# Patient Record
Sex: Male | Born: 1985
Health system: Southern US, Community
[De-identification: ages and names within clinical notes are randomized; demographics above are authoritative.]

## PROBLEM LIST (undated history)

## (undated) DIAGNOSIS — E7800 Pure hypercholesterolemia, unspecified: Secondary | ICD-10-CM

## (undated) DIAGNOSIS — M549 Dorsalgia, unspecified: Secondary | ICD-10-CM

## (undated) DIAGNOSIS — I10 Essential (primary) hypertension: Secondary | ICD-10-CM

## (undated) HISTORY — PX: OTHER SURGICAL HISTORY: SHX169

## (undated) HISTORY — PX: VASECTOMY: SHX75

---

## 2008-09-23 ENCOUNTER — Emergency Department (HOSPITAL_COMMUNITY): Admission: EM | Admit: 2008-09-23 | Discharge: 2008-09-23 | Payer: Self-pay | Admitting: Emergency Medicine

## 2009-07-12 ENCOUNTER — Emergency Department (HOSPITAL_COMMUNITY): Admission: EM | Admit: 2009-07-12 | Discharge: 2009-07-12 | Payer: Self-pay | Admitting: Emergency Medicine

## 2009-08-08 ENCOUNTER — Encounter: Admission: RE | Admit: 2009-08-08 | Discharge: 2009-08-08 | Payer: Self-pay | Admitting: Family Medicine

## 2009-08-08 ENCOUNTER — Emergency Department (HOSPITAL_COMMUNITY): Admission: EM | Admit: 2009-08-08 | Discharge: 2009-08-08 | Payer: Self-pay | Admitting: Family Medicine

## 2009-08-12 ENCOUNTER — Emergency Department (HOSPITAL_COMMUNITY): Admission: EM | Admit: 2009-08-12 | Discharge: 2009-08-13 | Payer: Self-pay | Admitting: Emergency Medicine

## 2010-01-14 ENCOUNTER — Emergency Department (HOSPITAL_COMMUNITY): Admission: EM | Admit: 2010-01-14 | Discharge: 2010-01-14 | Payer: Self-pay | Admitting: Emergency Medicine

## 2010-01-27 ENCOUNTER — Ambulatory Visit: Payer: Self-pay | Admitting: Interventional Radiology

## 2010-01-27 ENCOUNTER — Emergency Department (HOSPITAL_BASED_OUTPATIENT_CLINIC_OR_DEPARTMENT_OTHER): Admission: EM | Admit: 2010-01-27 | Discharge: 2010-01-27 | Payer: Self-pay | Admitting: Emergency Medicine

## 2010-09-06 LAB — POCT CARDIAC MARKERS
CKMB, poc: <1 ng/mL — ABNORMAL LOW (ref 1.0–8.0)
Myoglobin, poc: 55.4 ng/mL (ref 12–200)
Myoglobin, poc: 60.7 ng/mL (ref 12–200)
Troponin i, poc: <0.05 ng/mL (ref 0.00–0.09)

## 2010-09-06 LAB — DIFFERENTIAL
Basophils Absolute: 0.1 10*3/uL (ref 0.0–0.1)
Basophils Relative: 1 % (ref 0–1)
Eosinophils Absolute: 0 10*3/uL (ref 0.0–0.7)
Lymphs Abs: 1.6 10*3/uL (ref 0.7–4.0)
Monocytes Absolute: 0.5 10*3/uL (ref 0.1–1.0)
Monocytes Relative: 8 % (ref 3–12)

## 2010-09-06 LAB — CBC
Hemoglobin: 15.8 g/dL (ref 13.0–17.0)
MCH: 33.5 pg (ref 26.0–34.0)
Platelets: 224 10*3/uL (ref 150–400)
RBC: 4.72 MIL/uL (ref 4.22–5.81)
WBC: 6.7 10*3/uL (ref 4.0–10.5)

## 2010-09-06 LAB — POCT I-STAT, CHEM 8
Calcium, Ion: 1.05 mmol/L — ABNORMAL LOW (ref 1.12–1.32)
Chloride: 104 mEq/L (ref 96–112)
Creatinine, Ser: 1.2 mg/dL (ref 0.4–1.5)
Glucose, Bld: 97 mg/dL (ref 70–99)
Potassium: 4.2 mEq/L (ref 3.5–5.1)

## 2010-09-30 ENCOUNTER — Emergency Department (HOSPITAL_BASED_OUTPATIENT_CLINIC_OR_DEPARTMENT_OTHER)
Admission: EM | Admit: 2010-09-30 | Discharge: 2010-09-30 | Disposition: A | Discharge status: Home / Self Care | Payer: Managed Care, Other (non HMO) | Attending: Emergency Medicine | Admitting: Emergency Medicine

## 2010-09-30 DIAGNOSIS — K089 Disorder of teeth and supporting structures, unspecified: Secondary | ICD-10-CM | POA: Insufficient documentation

## 2010-09-30 DIAGNOSIS — F17200 Nicotine dependence, unspecified, uncomplicated: Secondary | ICD-10-CM | POA: Insufficient documentation

## 2010-10-29 ENCOUNTER — Emergency Department (HOSPITAL_BASED_OUTPATIENT_CLINIC_OR_DEPARTMENT_OTHER)
Admission: EM | Admit: 2010-10-29 | Discharge: 2010-10-30 | Disposition: A | Discharge status: Home / Self Care | Payer: Managed Care, Other (non HMO) | Attending: Emergency Medicine | Admitting: Emergency Medicine

## 2010-10-29 ENCOUNTER — Emergency Department (INDEPENDENT_AMBULATORY_CARE_PROVIDER_SITE_OTHER): Payer: Managed Care, Other (non HMO)

## 2010-10-29 DIAGNOSIS — S91309A Unspecified open wound, unspecified foot, initial encounter: Secondary | ICD-10-CM

## 2010-10-29 DIAGNOSIS — W268XXA Contact with other sharp object(s), not elsewhere classified, initial encounter: Secondary | ICD-10-CM | POA: Insufficient documentation

## 2010-12-22 ENCOUNTER — Emergency Department (HOSPITAL_BASED_OUTPATIENT_CLINIC_OR_DEPARTMENT_OTHER)
Admission: EM | Admit: 2010-12-22 | Discharge: 2010-12-22 | Disposition: A | Discharge status: Home / Self Care | Payer: Managed Care, Other (non HMO) | Attending: Emergency Medicine | Admitting: Emergency Medicine

## 2010-12-22 DIAGNOSIS — X500XXA Overexertion from strenuous movement or load, initial encounter: Secondary | ICD-10-CM | POA: Insufficient documentation

## 2010-12-22 DIAGNOSIS — Y9289 Other specified places as the place of occurrence of the external cause: Secondary | ICD-10-CM | POA: Insufficient documentation

## 2010-12-22 DIAGNOSIS — S4350XA Sprain of unspecified acromioclavicular joint, initial encounter: Secondary | ICD-10-CM | POA: Insufficient documentation

## 2011-02-26 ENCOUNTER — Encounter: Payer: Self-pay | Admitting: *Deleted

## 2011-02-26 DIAGNOSIS — S52599A Other fractures of lower end of unspecified radius, initial encounter for closed fracture: Secondary | ICD-10-CM | POA: Insufficient documentation

## 2011-02-26 DIAGNOSIS — X58XXXA Exposure to other specified factors, initial encounter: Secondary | ICD-10-CM | POA: Insufficient documentation

## 2011-02-26 NOTE — ED Notes (Signed)
C/o right wrist pain after being hit by car door, +PMS

## 2011-02-27 ENCOUNTER — Emergency Department (HOSPITAL_BASED_OUTPATIENT_CLINIC_OR_DEPARTMENT_OTHER)
Admission: EM | Admit: 2011-02-27 | Discharge: 2011-02-27 | Disposition: A | Discharge status: Home / Self Care | Payer: Managed Care, Other (non HMO) | Attending: Emergency Medicine | Admitting: Emergency Medicine

## 2011-02-27 ENCOUNTER — Emergency Department (INDEPENDENT_AMBULATORY_CARE_PROVIDER_SITE_OTHER): Payer: Managed Care, Other (non HMO)

## 2011-02-27 DIAGNOSIS — M25539 Pain in unspecified wrist: Secondary | ICD-10-CM

## 2011-02-27 DIAGNOSIS — S52599A Other fractures of lower end of unspecified radius, initial encounter for closed fracture: Secondary | ICD-10-CM

## 2011-02-27 DIAGNOSIS — IMO0002 Reserved for concepts with insufficient information to code with codable children: Secondary | ICD-10-CM

## 2011-02-27 MED ORDER — OXYCODONE-ACETAMINOPHEN 5-325 MG PO TABS: 2.0000 | ORAL_TABLET | ORAL | Status: AC | PRN

## 2011-02-27 MED ORDER — KETOROLAC TROMETHAMINE 60 MG/2ML IM SOLN
60.0000 mg | Freq: Once | INTRAMUSCULAR | Status: AC
  Administered 2011-02-27: 60 mg via INTRAMUSCULAR
  Filled 2011-02-27: qty 2

## 2011-02-27 NOTE — ED Provider Notes (Signed)
History     CSN: [id number] Arrival date & time: 02/27/2011 12:47 AM  Chief Complaint  Patient presents with  . Wrist Pain   Patient is a 25 y.o. male presenting with wrist pain. The history is provided by the patient. No language interpreter was used.  Wrist Pain This is a new problem. The problem has not changed since onset.Pertinent negatives include no chest pain, no abdominal pain, no headaches and no shortness of breath. The symptoms are aggravated by nothing. The symptoms are relieved by nothing. He has tried nothing for the symptoms. The treatment provided no relief.  Slammed distal right forearm in the car door.  10/10 pain   History reviewed. No pertinent past medical history.  History reviewed. No pertinent past surgical history.  History reviewed. No pertinent family history.  History  Substance Use Topics  . Smoking status: Never Smoker   . Smokeless tobacco: Not on file  . Alcohol Use: No      Review of Systems  Respiratory: Negative for shortness of breath.   Cardiovascular: Negative for chest pain.  Gastrointestinal: Negative for abdominal pain.  Neurological: Negative for headaches.    Physical Exam  BP 145/89  Pulse 97  Temp(Src) 98.6 F (37 C) (Oral)  Resp 17  Ht 5\' 10"  (1.778 m)  Wt 260 lb (117.935 kg)  BMI 37.31 kg/m2  SpO2 100%  Physical Exam  ED Course  Procedures  MDM Follow up with orthopedics      Ashwin Tibbs K Ulice Follett-Rasch, MD 02/27/11 4098

## 2011-02-27 NOTE — ED Notes (Signed)
Pt with small abrasion to right wrist wound cleaned and ointment applied as well as bandage

## 2011-06-25 ENCOUNTER — Emergency Department (HOSPITAL_BASED_OUTPATIENT_CLINIC_OR_DEPARTMENT_OTHER)
Admission: EM | Admit: 2011-06-25 | Discharge: 2011-06-25 | Disposition: A | Discharge status: Home / Self Care | Payer: Managed Care, Other (non HMO) | Attending: Emergency Medicine | Admitting: Emergency Medicine

## 2011-06-25 ENCOUNTER — Encounter (HOSPITAL_BASED_OUTPATIENT_CLINIC_OR_DEPARTMENT_OTHER): Payer: Self-pay | Admitting: *Deleted

## 2011-06-25 DIAGNOSIS — K0889 Other specified disorders of teeth and supporting structures: Secondary | ICD-10-CM

## 2011-06-25 DIAGNOSIS — K0381 Cracked tooth: Secondary | ICD-10-CM | POA: Insufficient documentation

## 2011-06-25 DIAGNOSIS — K089 Disorder of teeth and supporting structures, unspecified: Secondary | ICD-10-CM | POA: Insufficient documentation

## 2011-06-25 MED ORDER — CLINDAMYCIN HCL 150 MG PO CAPS: 300.0000 mg | ORAL_CAPSULE | Freq: Four times a day (QID) | ORAL | Status: AC

## 2011-06-25 MED ORDER — HYDROCODONE-ACETAMINOPHEN 5-325 MG PO TABS: 2.0000 | ORAL_TABLET | ORAL | Status: AC | PRN

## 2011-06-25 NOTE — ED Notes (Signed)
Right upper molar pain. Broke the tooth a couple of months ago.

## 2011-06-25 NOTE — ED Provider Notes (Signed)
History     CSN: [id number]  Arrival date & time 06/25/11  1114   First MD Initiated Contact with Patient 06/25/11 1336      Chief Complaint  Patient presents with  . Dental Pain    (Consider location/radiation/quality/duration/timing/severity/associated sxs/prior treatment) Patient is a 26 y.o. male presenting with tooth pain and dental injury. The history is provided by the patient. No language interpreter was used.  Dental PainThe primary symptoms include dental injury. The symptoms began 3 to 5 days ago. The symptoms are worsening. The symptoms are recurrent. The symptoms occur constantly.  Additional symptoms include: gum swelling and gum tenderness. Medical issues do not include: alcohol problem and smoking.   Dental Injury This is a new problem. The current episode started 1 to 4 weeks ago. The problem occurs constantly. The problem has been gradually worsening. The symptoms are aggravated by nothing. He has tried nothing for the symptoms.  Pt broke a tooth several months ago.  Pt reports now area is swollen and painful  History reviewed. No pertinent past medical history.  History reviewed. No pertinent past surgical history.  No family history on file.  History  Substance Use Topics  . Smoking status: Never Smoker   . Smokeless tobacco: Not on file  . Alcohol Use: No      Review of Systems  All other systems reviewed and are negative.    Allergies  Review of patient's allergies indicates no known allergies.  Home Medications  No current outpatient prescriptions on file.  BP 148/78  Pulse 92  Temp(Src) 98.3 F (36.8 C) (Oral)  Resp 20  SpO2 100%  Physical Exam  Nursing note and vitals reviewed. Constitutional: He is oriented to person, place, and time. He appears well-developed and well-nourished.  HENT:  Head: Normocephalic and atraumatic.  Right Ear: External ear normal.  Left Ear: External ear normal.       Broken right lower 2nd molar,  Broken  left lower 2nd molar  Eyes: Conjunctivae are normal. Pupils are equal, round, and reactive to light.  Neck: Normal range of motion.  Cardiovascular: Normal rate and regular rhythm.   Pulmonary/Chest: Effort normal.  Abdominal: Soft.  Musculoskeletal: Normal range of motion.  Neurological: He is alert and oriented to person, place, and time.  Skin: Skin is warm.  Psychiatric: He has a normal mood and affect.    ED Course  Procedures (including critical care time)  Labs Reviewed - No data to display No results found.   No diagnosis found.    MDM    Pt has a dental appointment in February     [place], [place] 06/25/11 1351

## 2011-06-26 NOTE — ED Provider Notes (Signed)
Medical screening examination/treatment/procedure(s) were performed by non-physician practitioner and as supervising physician I was immediately available for consultation/collaboration.  Emagene Merfeld, MD 06/26/11 0653 

## 2011-09-03 ENCOUNTER — Emergency Department (HOSPITAL_BASED_OUTPATIENT_CLINIC_OR_DEPARTMENT_OTHER)
Admission: EM | Admit: 2011-09-03 | Discharge: 2011-09-03 | Disposition: A | Discharge status: Home / Self Care | Payer: Managed Care, Other (non HMO) | Attending: Emergency Medicine | Admitting: Emergency Medicine

## 2011-09-03 ENCOUNTER — Encounter (HOSPITAL_BASED_OUTPATIENT_CLINIC_OR_DEPARTMENT_OTHER): Payer: Self-pay | Admitting: *Deleted

## 2011-09-03 ENCOUNTER — Emergency Department (INDEPENDENT_AMBULATORY_CARE_PROVIDER_SITE_OTHER): Payer: Managed Care, Other (non HMO)

## 2011-09-03 DIAGNOSIS — J3489 Other specified disorders of nose and nasal sinuses: Secondary | ICD-10-CM | POA: Insufficient documentation

## 2011-09-03 DIAGNOSIS — R05 Cough: Secondary | ICD-10-CM

## 2011-09-03 DIAGNOSIS — R059 Cough, unspecified: Secondary | ICD-10-CM

## 2011-09-03 DIAGNOSIS — R070 Pain in throat: Secondary | ICD-10-CM | POA: Insufficient documentation

## 2011-09-03 MED ORDER — HYDROCOD POLST-CHLORPHEN POLST 10-8 MG/5ML PO LQCR: 5.0000 mL | Freq: Two times a day (BID) | ORAL | Status: DC | PRN

## 2011-09-03 NOTE — ED Provider Notes (Signed)
History     CSN: [id number]  Arrival date & time 09/03/11  1312   First MD Initiated Contact with Patient 09/03/11 1342      Chief Complaint  Patient presents with  . Cough    (Consider location/radiation/quality/duration/timing/severity/associated sxs/prior treatment) Patient is a 26 y.o. male presenting with cough. The history is provided by the patient. No language interpreter was used.  Cough This is a new problem. The current episode started more than 2 days ago. The problem occurs constantly. The problem has not changed since onset.The cough is productive of sputum. There has been no fever. Associated symptoms include rhinorrhea and sore throat. Pertinent negatives include no myalgias. He has tried decongestants for the symptoms. The treatment provided no relief. He is not a smoker.    History reviewed. No pertinent past medical history.  History reviewed. No pertinent past surgical history.  No family history on file.  History  Substance Use Topics  . Smoking status: Never Smoker   . Smokeless tobacco: Not on file  . Alcohol Use: No      Review of Systems  HENT: Positive for sore throat and rhinorrhea.   Respiratory: Positive for cough.   Musculoskeletal: Negative for myalgias.  All other systems reviewed and are negative.    Allergies  Review of patient's allergies indicates no known allergies.  Home Medications  No current outpatient prescriptions on file.  BP 145/87  Pulse 95  Temp(Src) 98 F (36.7 C) (Oral)  Resp 18  Ht 5\' 11"  (1.803 m)  Wt 260 lb (117.935 kg)  BMI 36.26 kg/m2  SpO2 100%  Physical Exam  Nursing note and vitals reviewed. Constitutional: He is oriented to person, place, and time. He appears well-developed and well-nourished.  HENT:  Head: Normocephalic and atraumatic.  Right Ear: External ear normal.  Left Ear: External ear normal.  Mouth/Throat: Posterior oropharyngeal erythema present.  Eyes: Conjunctivae and EOM are  normal.  Neck: Neck supple.  Cardiovascular: Normal rate and regular rhythm.   Pulmonary/Chest: Effort normal and breath sounds normal.  Musculoskeletal: Normal range of motion.  Neurological: He is alert and oriented to person, place, and time.  Skin: Skin is warm.  Psychiatric: He has a normal mood and affect.    ED Course  Procedures (including critical care time)  Labs Reviewed - No data to display Dg Chest 2 View  09/03/2011  *RADIOLOGY REPORT*  Clinical Data: Cough.  Sinus congestion.  CHEST - 2 VIEW  Comparison:  01/14/2010  Findings:  The heart size and mediastinal contours are within normal limits.  Both lungs are clear.  The visualized skeletal structures are unremarkable.  IMPRESSION: No active cardiopulmonary disease.  Original Report Authenticated By: Danae Orleans, M.D.     1. Cough       MDM  Symptoms likely viral:will treat symptomatically       Teressa Lower, NP 09/03/11 1512

## 2011-09-03 NOTE — Discharge Instructions (Signed)
Cough, Adult  A cough is a reflex. It helps you clear your throat and airways. A cough can help heal your body. A cough can last 2 or 3 weeks (acute) or may last more than 8 weeks (chronic). Some common causes of a cough can include an infection, allergy, or a cold. HOME CARE  Only take medicine as told by your doctor.   If given, take your medicines (antibiotics) as told. Finish them even if you start to feel better.   Use a cold steam vaporizer or humidier in your home. This can help loosen thick spit (secretions).   Sleep so you are almost sitting up (semi-upright). Use pillows to do this. This helps reduce coughing.   Rest as needed.   Stop smoking if you smoke.  GET HELP RIGHT AWAY IF:  You have yellowish-white fluid (pus) in your thick spit.   Your cough gets worse.   Your medicine does not reduce coughing, and you are losing sleep.   You cough up blood.   You have trouble breathing.   Your pain gets worse and medicine does not help.   You have a fever.  MAKE SURE YOU:   Understand these instructions.   Will watch your condition.   Will get help right away if you are not doing well or get worse.  Document Released: 02/19/2011 Document Revised: 05/28/2011 Document Reviewed: 02/19/2011 Exit[employer] Patient Information 2012 [employer]. 

## 2011-09-03 NOTE — ED Notes (Signed)
Stuffy nose and cough

## 2011-09-17 NOTE — ED Provider Notes (Signed)
Evaluation and management procedures were performed by the PA/NP under my supervision/collaboration.   [place] Carmack D Domonique Brouillard, MD 09/17/11 1020 

## 2011-11-30 ENCOUNTER — Emergency Department (HOSPITAL_BASED_OUTPATIENT_CLINIC_OR_DEPARTMENT_OTHER)
Admission: EM | Admit: 2011-11-30 | Discharge: 2011-11-30 | Disposition: A | Discharge status: Home / Self Care | Payer: Worker's Compensation | Attending: Emergency Medicine | Admitting: Emergency Medicine

## 2011-11-30 ENCOUNTER — Encounter (HOSPITAL_BASED_OUTPATIENT_CLINIC_OR_DEPARTMENT_OTHER): Payer: Self-pay | Admitting: *Deleted

## 2011-11-30 DIAGNOSIS — M545 Low back pain, unspecified: Secondary | ICD-10-CM

## 2011-11-30 MED ORDER — IBUPROFEN 800 MG PO TABS: 800.0000 mg | ORAL_TABLET | Freq: Three times a day (TID) | ORAL | Status: AC | PRN

## 2011-11-30 MED ORDER — HYDROCODONE-ACETAMINOPHEN 5-325 MG PO TABS: ORAL_TABLET | ORAL | Status: AC

## 2011-11-30 MED ORDER — METHOCARBAMOL 500 MG PO TABS: 1000.0000 mg | ORAL_TABLET | Freq: Four times a day (QID) | ORAL | Status: AC

## 2011-11-30 NOTE — Discharge Instructions (Signed)
Please read and follow all provided instructions.  Your diagnoses today include:  1. Low back pain     Tests performed today include:  Vital signs - see below for your results today  Medications prescribed:   Vicodin (hydrocodone/acetaminophen) - narcotic pain medication  You have been prescribed narcotic pain medication such as Vicodin or Percocet: DO NOT drive or perform any activities that require you to be awake and alert because this medicine can make you drowsy. BE VERY CAREFUL not to take multiple medicines containing Tylenol (also called acetaminophen). Doing so can lead to an overdose which can damage your liver and cause liver failure and possibly death.    Robaxin (methocarbamol) - muscle relaxer medication  You have been prescribed a muscle relaxer medication such as Robaxin, Flexeril, or Valium: DO NOT drive or perform any activities that require you to be awake and alert because this medicine can make you drowsy.    Ibuprofen - anti-inflammatory pain medication  Do not exceed 800mg  ibuprofen every 8 hours  You have been prescribed an anti-inflammatory medication or NSAID. Take with food. Take smallest effective dose for the shortest duration needed for your pain. Stop taking if you experience stomach pain or vomiting.   Take any prescribed medications only as directed.  Home care instructions:   Follow any educational materials contained in this packet  Please rest, use ice or heat on your back for the next several days  Do not lift, push, pull anything more than 10 pounds for the next week  Follow-up instructions: Please follow-up with your primary care provider in the next 1 week for further evaluation of your symptoms. If you do not have a primary care doctor -- see below for referral information.   Return instructions:  SEEK IMMEDIATE MEDICAL ATTENTION IF YOU HAVE:  New numbness, tingling, weakness, or problem with the use of your arms or legs  Severe  back pain not relieved with medications  Loss control of your bowels or bladder  Increasing pain in any areas of the body (such as chest or abdominal pain)  Shortness of breath, dizziness, or fainting.   Worsening nausea (feeling sick to your stomach), vomiting, fever, or sweats  Any other emergent concerns regarding your health   Additional Information:  Your vital signs today were: BP 142/86  Pulse 102  Temp(Src) 99.1 F (37.3 C) (Oral)  Resp 20  SpO2 100% If your blood pressure (BP) was elevated above 135/85 this visit, please have this repeated by your doctor within one month. -------------- No Primary Care Doctor Call [facility]  [phone] Other agencies that provide inexpensive medical care    [facility]  [phone]    [facility]  [phone]    [facility]  [phone]    [facility]  [phone]    [facility]  3080770889    [facility]  [phone] -------------- RESOURCE GUIDE:  Dental Problems  Patients with Medicaid: [facility] [phone] [address]                                            [phone] [address] Phone:  [phone]  Phone:  [phone]  If unable to pay or uninsured, contact:  [facility] or [facility]. to become qualified for the adult dental clinic.  Chronic Pain Problems Contact [facility]  [phone] Patients need to be referred by their primary care doctor.  Insufficient Money for Medicine Contact [employer]:  call "211" or [facility] [phone].  Psychological Services [facility]  [phone] [facility]  [phone] [facility]   [phone] (emergency services [phone])  Substance Abuse Resources [facility]  [phone] [facility] [phone] [facility]  [phone] Floydene Flock [phone] Residential & Outpatient Substance Abuse Program  [phone]  Abuse/Neglect [facility] Child Abuse Hotline 3235231804 [facility] Child Abuse Hotline [phone] (After Hours)  Emergency Shelter [facility] [phone]  Maternity Homes [facility] [place] [facility] [phone] [facility] [phone]  [facility] Resources  [facility]     [employer]                          [facility] Dept. [address] [place]                       [address]      [address]  Blondell Reveal Phone:  [phone]                                   Phone:  [phone]                 Phone:  [phone]  [place] [facility] Phone:  [phone]  [facility] Child Abuse Hotline [phone])047-5451 [phone])[phone] (After Hours)

## 2011-11-30 NOTE — ED Notes (Signed)
Lower back pain after lifting 200# object at work.

## 2011-11-30 NOTE — ED Provider Notes (Signed)
Medical screening examination/treatment/procedure(s) were performed by non-physician practitioner and as supervising physician I was immediately available for consultation/collaboration.   Jahel Wavra Y. Brinly Maietta, MD 11/30/11 2347 

## 2011-11-30 NOTE — ED Provider Notes (Signed)
History     CSN: [id number]  Arrival date & time 11/30/11  1757   First MD Initiated Contact with Patient 11/30/11 1847      Chief Complaint  Patient presents with  . Back Pain    (Consider location/radiation/quality/duration/timing/severity/associated sxs/prior treatment) HPI Comments: Patient presents with complaint of left-sided lower back pain that began acutely after he tried to lift a 200 pound object at work. Patient states he has had pain that has been persistent since that time. No treatments prior to arrival. Pain is made worse with certain positions including bending. It is not made worse by twisting. Patient denies numbness, tingling, or weakness in his lower extremities. He denies loss of bowel or bladder control. Patient did not hear a popping sound. Patient denies other injuries.  Patient is a 26 y.o. male presenting with back pain. The history is provided by the patient.  Back Pain  This is a new problem. The current episode started 3 to 5 hours ago. The problem occurs constantly. The problem has not changed since onset.The pain is associated with lifting heavy objects. The pain is present in the lumbar spine (left sided). The quality of the pain is described as aching. The pain does not radiate. The pain is moderate. The symptoms are aggravated by bending. Pertinent negatives include no fever, no numbness, no abdominal pain, no bowel incontinence, no bladder incontinence, no dysuria, no tingling and no weakness. He has tried nothing for the symptoms.    History reviewed. No pertinent past medical history.  History reviewed. No pertinent past surgical history.  No family history on file.  History  Substance Use Topics  . Smoking status: Never Smoker   . Smokeless tobacco: Not on file  . Alcohol Use: No      Review of Systems  Constitutional: Negative for fever and unexpected weight change.  Gastrointestinal: Negative for abdominal pain, constipation and bowel  incontinence.       Neg for fecal incontinence  Genitourinary: Negative for bladder incontinence, dysuria, hematuria, flank pain and difficulty urinating.       Negative for urinary incontinence or retention  Musculoskeletal: Positive for back pain.  Neurological: Negative for tingling, weakness and numbness.       Negative for saddle paresthesias     Allergies  Review of patient's allergies indicates no known allergies.  Home Medications  No current outpatient prescriptions on file.  BP 142/86  Pulse 102  Temp(Src) 99.1 F (37.3 C) (Oral)  Resp 20  SpO2 100%  Physical Exam  Nursing note and vitals reviewed. Constitutional: He appears well-developed and well-nourished.  HENT:  Head: Normocephalic and atraumatic.  Eyes: Conjunctivae are normal.  Neck: Normal range of motion.  Abdominal: Soft. There is no tenderness. There is no CVA tenderness.  Musculoskeletal: Normal range of motion. He exhibits no tenderness.       There is no tenderness to palpation over cervical/thoracic/lumbar/sacral spine. Tenderness to palpation over L lumbar paraspinal muscles. No step-off noted with palpation of spine.   Neurological: He is alert. He has normal reflexes. No sensory deficit. He exhibits normal muscle tone. Gait normal. GCS eye subscore is 4. GCS verbal subscore is 5. GCS motor subscore is 6.       5/5 strength in entire lower extremities bilaterally. No sensation deficit.   Skin: Skin is warm and dry.  Psychiatric: He has a normal mood and affect.    ED Course  Procedures (including critical care time)  Labs Reviewed -  No data to display No results found.   1. Low back pain     7:20 PM Patient seen and examined. Work-up initiated. Medications ordered.   Vital signs reviewed and are as follows: Filed Vitals:   11/30/11 1805  BP: 142/86  Pulse: 102  Temp: 99.1 F (37.3 C)  Resp: 20   No red flag s/s of low back pain. Patient was counseled on back pain precautions and  told to do activity as tolerated but do not lift, push, or pull heavy objects more than 10 pounds for the next week.  Patient counseled to use ice or heat on back for no longer than 15 minutes every hour.   Patient prescribed muscle relaxer and counseled on proper use of muscle relaxant medication.    Patient prescribed narcotic pain medicine and counseled on proper use of narcotic pain medications. Counseled not to combine this medication with others containing tylenol.   Urged patient not to drink alcohol, drive, or perform any other activities that requires focus while taking either of these medications.  Patient urged to follow-up with PCP if pain does not improve with treatment and rest or if pain becomes recurrent. Urged to return with worsening severe pain, loss of bowel or bladder control, trouble walking.   The patient verbalizes understanding and agrees with the plan.   MDM  Patient with back pain. No neurological deficits. Patient is ambulatory. No warning symptoms of back pain including: loss of bowel or bladder control, night sweats, waking from sleep with back pain, unexplained fevers or weight loss, h/o cancer, IVDU, recent trauma. No concern for cauda equina, epidural abscess, or other serious cause of back pain. Conservative measures such as rest, ice/heat and pain medicine indicated with PCP follow-up if no improvement with conservative management.          Renne Crigler, [place] 11/30/11 2303

## 2011-11-30 NOTE — ED Notes (Signed)
Pt. Has been seen by Dr. Oletta Lamas and is in no distress

## 2013-04-27 ENCOUNTER — Encounter (HOSPITAL_BASED_OUTPATIENT_CLINIC_OR_DEPARTMENT_OTHER): Payer: Self-pay | Admitting: Emergency Medicine

## 2013-04-27 ENCOUNTER — Emergency Department (HOSPITAL_BASED_OUTPATIENT_CLINIC_OR_DEPARTMENT_OTHER)
Admission: EM | Admit: 2013-04-27 | Discharge: 2013-04-28 | Disposition: A | Discharge status: Home / Self Care | Payer: Managed Care, Other (non HMO) | Attending: Emergency Medicine | Admitting: Emergency Medicine

## 2013-04-27 DIAGNOSIS — M5416 Radiculopathy, lumbar region: Secondary | ICD-10-CM

## 2013-04-27 DIAGNOSIS — IMO0002 Reserved for concepts with insufficient information to code with codable children: Secondary | ICD-10-CM | POA: Insufficient documentation

## 2013-04-27 LAB — URINALYSIS, ROUTINE W REFLEX MICROSCOPIC
Hgb urine dipstick: NEGATIVE
Leukocytes, UA: NEGATIVE
Specific Gravity, Urine: 1.034 — ABNORMAL HIGH (ref 1.005–1.030)
Urobilinogen, UA: 1 mg/dL (ref 0.0–1.0)

## 2013-04-27 NOTE — ED Notes (Signed)
Abdominal pain in his left lower quadrant into his groin. Pain started this am and has gotten worse throughout the day.

## 2013-04-28 ENCOUNTER — Emergency Department (HOSPITAL_BASED_OUTPATIENT_CLINIC_OR_DEPARTMENT_OTHER): Payer: Managed Care, Other (non HMO)

## 2013-04-28 MED ORDER — HYDROCODONE-ACETAMINOPHEN 5-325 MG PO TABS: 1.0000 | ORAL_TABLET | Freq: Four times a day (QID) | ORAL | Status: DC | PRN
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
## 2013-09-09 ENCOUNTER — Emergency Department (HOSPITAL_BASED_OUTPATIENT_CLINIC_OR_DEPARTMENT_OTHER)
Admission: EM | Admit: 2013-09-09 | Discharge: 2013-09-09 | Disposition: A | Discharge status: Home / Self Care | Payer: Managed Care, Other (non HMO) | Attending: Emergency Medicine | Admitting: Emergency Medicine

## 2013-09-09 ENCOUNTER — Emergency Department (HOSPITAL_BASED_OUTPATIENT_CLINIC_OR_DEPARTMENT_OTHER): Payer: Managed Care, Other (non HMO)

## 2013-09-09 ENCOUNTER — Encounter (HOSPITAL_BASED_OUTPATIENT_CLINIC_OR_DEPARTMENT_OTHER): Payer: Self-pay | Admitting: Emergency Medicine

## 2013-09-09 DIAGNOSIS — M25579 Pain in unspecified ankle and joints of unspecified foot: Secondary | ICD-10-CM | POA: Insufficient documentation

## 2013-09-09 DIAGNOSIS — M79673 Pain in unspecified foot: Secondary | ICD-10-CM

## 2013-09-09 DIAGNOSIS — Z9889 Other specified postprocedural states: Secondary | ICD-10-CM | POA: Insufficient documentation

## 2013-09-09 MED ORDER — IBUPROFEN 800 MG PO TABS: 800.0000 mg | ORAL_TABLET | Freq: Three times a day (TID) | ORAL | Status: DC

## 2013-09-09 MED ORDER — HYDROCODONE-ACETAMINOPHEN 5-325 MG PO TABS: 1.0000 | ORAL_TABLET | Freq: Four times a day (QID) | ORAL | Status: DC | PRN

## 2013-09-09 NOTE — ED Notes (Signed)
Patient here with left foot pain that started yesterday. Reports that the pain started while picking up son at school. Pain radiates along top of foot to inner aspect, slight swelling noted, denies trauma.

## 2013-09-09 NOTE — Discharge Instructions (Signed)
Musculoskeletal Pain °Musculoskeletal pain is muscle and boney aches and pains. These pains can occur in any part of the body. Your caregiver may treat you without knowing the cause of the pain. They may treat you if blood or urine tests, X-rays, and other tests were normal.  °CAUSES °There is often not a definite cause or reason for these pains. These pains may be caused by a type of germ (virus). The discomfort may also come from overuse. Overuse includes working out too hard when your body is not fit. Boney aches also come from weather changes. Bone is sensitive to atmospheric pressure changes. °HOME CARE INSTRUCTIONS  °· Ask when your test results will be ready. Make sure you get your test results. °· Only take over-the-counter or prescription medicines for pain, discomfort, or fever as directed by your caregiver. If you were given medications for your condition, do not drive, operate machinery or power tools, or sign legal documents for 24 hours. Do not drink alcohol. Do not take sleeping pills or other medications that may interfere with treatment. °· Continue all activities unless the activities cause more pain. When the pain lessens, slowly resume normal activities. Gradually increase the intensity and duration of the activities or exercise. °· During periods of severe pain, bed rest may be helpful. Lay or sit in any position that is comfortable. °· Putting ice on the injured area. °· Put ice in a bag. °· Place a towel between your skin and the bag. °· Leave the ice on for 15 to 20 minutes, 3 to 4 times a day. °· Follow up with your caregiver for continued problems and no reason can be found for the pain. If the pain becomes worse or does not go away, it may be necessary to repeat tests or do additional testing. Your caregiver may need to look further for a possible cause. °SEEK IMMEDIATE MEDICAL CARE IF: °· You have pain that is getting worse and is not relieved by medications. °· You develop chest pain  that is associated with shortness or breath, sweating, feeling sick to your stomach (nauseous), or throw up (vomit). °· Your pain becomes localized to the abdomen. °· You develop any new symptoms that seem different or that concern you. °MAKE SURE YOU:  °· Understand these instructions. °· Will watch your condition. °· Will get help right away if you are not doing well or get worse. °Document Released: 06/08/2005 Document Revised: 08/31/2011 Document Reviewed: 02/10/2013 °ExitCare® Patient Information ©2014 [employer]. ° °
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
## 2014-03-16 ENCOUNTER — Emergency Department (HOSPITAL_BASED_OUTPATIENT_CLINIC_OR_DEPARTMENT_OTHER)
Admission: EM | Admit: 2014-03-16 | Discharge: 2014-03-16 | Disposition: A | Discharge status: Home / Self Care | Payer: Managed Care, Other (non HMO) | Attending: Emergency Medicine | Admitting: Emergency Medicine

## 2014-03-16 ENCOUNTER — Emergency Department (HOSPITAL_BASED_OUTPATIENT_CLINIC_OR_DEPARTMENT_OTHER): Payer: Managed Care, Other (non HMO)

## 2014-03-16 ENCOUNTER — Encounter (HOSPITAL_BASED_OUTPATIENT_CLINIC_OR_DEPARTMENT_OTHER): Payer: Self-pay | Admitting: Emergency Medicine

## 2014-03-16 DIAGNOSIS — Z791 Long term (current) use of non-steroidal anti-inflammatories (NSAID): Secondary | ICD-10-CM | POA: Insufficient documentation

## 2014-03-16 DIAGNOSIS — R109 Unspecified abdominal pain: Secondary | ICD-10-CM | POA: Diagnosis present

## 2014-03-16 DIAGNOSIS — R1031 Right lower quadrant pain: Secondary | ICD-10-CM

## 2014-03-16 LAB — BASIC METABOLIC PANEL
Anion gap: 14 (ref 5–15)
BUN: 14 mg/dL (ref 6–23)
CALCIUM: 9.5 mg/dL (ref 8.4–10.5)
CO2: 24 mEq/L (ref 19–32)
CREATININE: 0.9 mg/dL (ref 0.50–1.35)
Chloride: 99 mEq/L (ref 96–112)
Glucose, Bld: 109 mg/dL — ABNORMAL HIGH (ref 70–99)
Potassium: 4.1 mEq/L (ref 3.7–5.3)
Sodium: 137 mEq/L (ref 137–147)

## 2014-03-16 LAB — URINALYSIS, ROUTINE W REFLEX MICROSCOPIC
Bilirubin Urine: NEGATIVE
Glucose, UA: NEGATIVE mg/dL
Hgb urine dipstick: NEGATIVE
KETONES UR: NEGATIVE mg/dL
LEUKOCYTES UA: NEGATIVE
NITRITE: NEGATIVE
Protein, ur: NEGATIVE mg/dL
Specific Gravity, Urine: 1.018 (ref 1.005–1.030)
UROBILINOGEN UA: 1 mg/dL (ref 0.0–1.0)
pH: 7 (ref 5.0–8.0)

## 2014-03-16 MED ORDER — IOHEXOL 300 MG/ML  SOLN
25.0000 mL | Freq: Once | INTRAMUSCULAR | Status: AC | PRN
  Administered 2014-03-16: 25 mL via ORAL

## 2014-03-16 MED ORDER — IOHEXOL 300 MG/ML  SOLN
100.0000 mL | Freq: Once | INTRAMUSCULAR | Status: AC | PRN
  Administered 2014-03-16: 100 mL via INTRAVENOUS

## 2014-03-16 MED ORDER — TRAMADOL HCL 50 MG PO TABS: 50.0000 mg | ORAL_TABLET | Freq: Four times a day (QID) | ORAL | Status: DC | PRN

## 2014-03-16 NOTE — Discharge Instructions (Signed)
Pain of Unknown Etiology (Pain Without a Known Cause) °You have come to your caregiver because of pain. Pain can occur in any part of the body. Often there is not a definite cause. If your laboratory (blood or urine) work was normal and X-rays or other studies were normal, your caregiver may treat you without knowing the cause of the pain. An example of this is the headache. Most headaches are diagnosed by taking a history. This means your caregiver asks you questions about your headaches. Your caregiver determines a treatment based on your answers. Usually testing done for headaches is normal. Often testing is not done unless there is no response to medications. Regardless of where your pain is located today, you can be given medications to make you comfortable. If no physical cause of pain can be found, most cases of pain will gradually leave as suddenly as they came.  °If you have a painful condition and no reason can be found for the pain, it is important that you follow up with your caregiver. If the pain becomes worse or does not go away, it may be necessary to repeat tests and look further for a possible cause. °· Only take over-the-counter or prescription medicines for pain, discomfort, or fever as directed by your caregiver. °· For the protection of your privacy, test results cannot be given over the phone. Make sure you receive the results of your test. Ask how these results are to be obtained if you have not been informed. It is your responsibility to obtain your test results. °· You may continue all activities unless the activities cause more pain. When the pain lessens, it is important to gradually resume normal activities. Resume activities by beginning slowly and gradually increasing the intensity and duration of the activities or exercise. During periods of severe pain, bed rest may be helpful. Lie or sit in any position that is comfortable. °· Ice used for acute (sudden) conditions may be effective.  Use a large plastic bag filled with ice and wrapped in a towel. This may provide pain relief. °· See your caregiver for continued problems. Your caregiver can help or refer you for exercises or physical therapy if necessary. °If you were given medications for your condition, do not drive, operate machinery or power tools, or sign legal documents for 24 hours. Do not drink alcohol, take sleeping pills, or take other medications that may interfere with treatment. °See your caregiver immediately if you have pain that is becoming worse and not relieved by medications. °Document Released: 03/03/2001 Document Revised: 03/29/2013 Document Reviewed: 06/08/2005 °ExitCare® Patient Information ©2015 [employer]. This information is not intended to replace advice given to you by your health care provider. Make sure you discuss any questions you have with your health care provider. ° °

## 2014-03-16 NOTE — ED Provider Notes (Signed)
Medical screening examination/treatment/procedure(s) were performed by non-physician practitioner and as supervising physician I was immediately available for consultation/collaboration.   EKG Interpretation None       Saleha Kalp, MD 03/16/14 2013 
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
## 2014-07-09 ENCOUNTER — Encounter (HOSPITAL_BASED_OUTPATIENT_CLINIC_OR_DEPARTMENT_OTHER): Payer: Self-pay

## 2014-07-09 ENCOUNTER — Emergency Department (HOSPITAL_BASED_OUTPATIENT_CLINIC_OR_DEPARTMENT_OTHER)
Admission: EM | Admit: 2014-07-09 | Discharge: 2014-07-09 | Disposition: A | Discharge status: Home / Self Care | Payer: Managed Care, Other (non HMO) | Attending: Emergency Medicine | Admitting: Emergency Medicine

## 2014-07-09 DIAGNOSIS — Z79899 Other long term (current) drug therapy: Secondary | ICD-10-CM | POA: Insufficient documentation

## 2014-07-09 DIAGNOSIS — M545 Low back pain, unspecified: Secondary | ICD-10-CM

## 2014-07-09 MED ORDER — CYCLOBENZAPRINE HCL 5 MG PO TABS: 5.0000 mg | ORAL_TABLET | Freq: Three times a day (TID) | ORAL | Status: DC | PRN

## 2014-07-09 MED ORDER — KETOROLAC TROMETHAMINE 60 MG/2ML IM SOLN
60.0000 mg | Freq: Once | INTRAMUSCULAR | Status: AC
  Administered 2014-07-09: 60 mg via INTRAMUSCULAR
  Filled 2014-07-09: qty 2

## 2014-07-09 NOTE — Discharge Instructions (Signed)
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
## 2015-02-07 ENCOUNTER — Encounter (HOSPITAL_BASED_OUTPATIENT_CLINIC_OR_DEPARTMENT_OTHER): Payer: Self-pay

## 2015-02-07 ENCOUNTER — Emergency Department (HOSPITAL_BASED_OUTPATIENT_CLINIC_OR_DEPARTMENT_OTHER)
Admission: EM | Admit: 2015-02-07 | Discharge: 2015-02-07 | Disposition: A | Discharge status: Home / Self Care | Payer: Managed Care, Other (non HMO) | Attending: Emergency Medicine | Admitting: Emergency Medicine

## 2015-02-07 DIAGNOSIS — I10 Essential (primary) hypertension: Secondary | ICD-10-CM | POA: Insufficient documentation

## 2015-02-07 DIAGNOSIS — M791 Myalgia: Secondary | ICD-10-CM | POA: Diagnosis not present

## 2015-02-07 DIAGNOSIS — M549 Dorsalgia, unspecified: Secondary | ICD-10-CM | POA: Diagnosis present

## 2015-02-07 DIAGNOSIS — E780 Pure hypercholesterolemia: Secondary | ICD-10-CM | POA: Diagnosis not present

## 2015-02-07 DIAGNOSIS — M5432 Sciatica, left side: Secondary | ICD-10-CM

## 2015-02-07 HISTORY — DX: Pure hypercholesterolemia, unspecified: E78.00

## 2015-02-07 HISTORY — DX: Dorsalgia, unspecified: M54.9

## 2015-02-07 HISTORY — DX: Essential (primary) hypertension: I10

## 2015-02-07 MED ORDER — PREDNISONE 50 MG PO TABS
60.0000 mg | ORAL_TABLET | Freq: Once | ORAL | Status: AC
  Administered 2015-02-07: 60 mg via ORAL
  Filled 2015-02-07 (×2): qty 1

## 2015-02-07 MED ORDER — OXYCODONE-ACETAMINOPHEN 5-325 MG PO TABS: 2.0000 | ORAL_TABLET | ORAL | Status: DC | PRN

## 2015-02-07 MED ORDER — NAPROXEN 500 MG PO TABS: 500.0000 mg | ORAL_TABLET | Freq: Two times a day (BID) | ORAL | Status: DC

## 2015-02-07 MED ORDER — PREDNISONE 20 MG PO TABS: 20.0000 mg | ORAL_TABLET | Freq: Two times a day (BID) | ORAL | Status: DC

## 2015-02-07 NOTE — Discharge Instructions (Signed)
Physical Therapy referral--May need pre-autoorization from PCP or insurance. Stretches/exercises below--once per day only until seen by physical therapy. Recheck with numbness, weakness, loss of bowel or bladder control, foot drop, other changes.    Sciatica Sciatica is pain, weakness, numbness, or tingling along the path of the sciatic nerve. The nerve starts in the lower back and runs down the back of each leg. The nerve controls the muscles in the lower leg and in the back of the knee, while also providing sensation to the back of the thigh, lower leg, and the sole of your foot. Sciatica is a symptom of another medical condition. For instance, nerve damage or certain conditions, such as a herniated disk or bone spur on the spine, pinch or put pressure on the sciatic nerve. This causes the pain, weakness, or other sensations normally associated with sciatica. Generally, sciatica only affects one side of the body. CAUSES   Herniated or slipped disc.  Degenerative disk disease.  A pain disorder involving the narrow muscle in the buttocks (piriformis syndrome).  Pelvic injury or fracture.  Pregnancy.  Tumor (rare). SYMPTOMS  Symptoms can vary from mild to very severe. The symptoms usually travel from the low back to the buttocks and down the back of the leg. Symptoms can include:  Mild tingling or dull aches in the lower back, leg, or hip.  Numbness in the back of the calf or sole of the foot.  Burning sensations in the lower back, leg, or hip.  Sharp pains in the lower back, leg, or hip.  Leg weakness.  Severe back pain inhibiting movement. These symptoms may get worse with coughing, sneezing, laughing, or prolonged sitting or standing. Also, being overweight may worsen symptoms. DIAGNOSIS  Your caregiver will perform a physical exam to look for common symptoms of sciatica. He or she may ask you to do certain movements or activities that would trigger sciatic nerve pain. Other  tests may be performed to find the cause of the sciatica. These may include:  Blood tests.  X-rays.  Imaging tests, such as an MRI or CT scan. TREATMENT  Treatment is directed at the cause of the sciatic pain. Sometimes, treatment is not necessary and the pain and discomfort goes away on its own. If treatment is needed, your caregiver may suggest:  Over-the-counter medicines to relieve pain.  Prescription medicines, such as anti-inflammatory medicine, muscle relaxants, or narcotics.  Applying heat or ice to the painful area.  Steroid injections to lessen pain, irritation, and inflammation around the nerve.  Reducing activity during periods of pain.  Exercising and stretching to strengthen your abdomen and improve flexibility of your spine. Your caregiver may suggest losing weight if the extra weight makes the back pain worse.  Physical therapy.  Surgery to eliminate what is pressing or pinching the nerve, such as a bone spur or part of a herniated disk. HOME CARE INSTRUCTIONS   Only take over-the-counter or prescription medicines for pain or discomfort as directed by your caregiver.  Apply ice to the affected area for 20 minutes, 3-4 times a day for the first 48-72 hours. Then try heat in the same way.  Exercise, stretch, or perform your usual activities if these do not aggravate your pain.  Attend physical therapy sessions as directed by your caregiver.  Keep all follow-up appointments as directed by your caregiver.  Do not wear high heels or shoes that do not provide proper support.  Check your mattress to see if it is too soft.  A firm mattress may lessen your pain and discomfort. SEEK IMMEDIATE MEDICAL CARE IF:   You lose control of your bowel or bladder (incontinence).  You have increasing weakness in the lower back, pelvis, buttocks, or legs.  You have redness or swelling of your back.  You have a burning sensation when you urinate.  You have pain that gets  worse when you lie down or awakens you at night.  Your pain is worse than you have experienced in the past.  Your pain is lasting longer than 4 weeks.  You are suddenly losing weight without reason. MAKE SURE YOU:  Understand these instructions.  Will watch your condition.  Will get help right away if you are not doing well or get worse. Document Released: 06/02/2001 Document Revised: 12/08/2011 Document Reviewed: 10/18/2011 [facility] Patient Information 2015 [place], [place]. This information is not intended to replace advice given to you by your health care provider. Make sure you discuss any questions you have with your health care provider.  Piriformis Syndrome with Rehab Piriformis syndrome is a condition the affects the nervous system in the area of the hip, and is characterized by pain and possibly a loss of feeling in the backside (posterior) thigh that may extend down the entire length of the leg. The symptoms are caused by an increase in pressure on the sciatic nerve by the piriformis muscle, which is on the back of the hip and is responsible for externally rotating the hip. The sciatic nerve and its branches connect to much of the leg. Normally the sciatic nerve runs between the piriformis muscle and other muscles. However, in certain individuals the nerve runs through the muscle, which causes an increase in pressure on the nerve and results in the symptoms of piriformis syndrome. SYMPTOMS   Pain, tingling, numbness, or burning in the back of the thigh that may also extend down the entire leg.  Occasionally, tenderness in the buttock.  Loss of function of the leg.  Pain that worsens when using the piriformis muscle (running, jumping, or stairs).  Pain that increases with prolonged sitting.  Pain that is lessened by lying flat on the back. CAUSES   Piriformis syndrome is the result of an increase in pressure placed on the sciatic nerve. Oftentimes, piriformis syndrome is an  overuse injury.  Stress placed on the nerve from a sudden increase in the intensity, frequency, or duration of training.  Compensation of other extremity injuries. RISK INCREASES WITH:  Sports that involve the piriformis muscle (running, walking, or jumping).  You are born with (congenital) a defect in which the sciatic nerve passes through the muscle. PREVENTION  Warm up and stretch properly before activity.  Allow for adequate recovery between workouts.  Maintain physical fitness:  Strength, flexibility, and endurance.  Cardiovascular fitness. PROGNOSIS  If treated properly, the symptoms of piriformis syndrome usually resolve in 2 to 6 weeks. RELATED COMPLICATIONS   Persistent and possibly permanent pain and numbness in the lower extremity.  Weakness of the extremity that may progress to disability and inability to compete. TREATMENT  The most effective treatment for piriformis syndrome is rest from any activities that aggravate the symptoms. Ice and pain medication may help reduce pain and inflammation. The use of strengthening and stretching exercises may help reduce pain with activity. These exercises may be performed at home or with a therapist. A referral to a therapist may be given for further evaluation and treatment, such as ultrasound. Corticosteroid injections may be given to reduce  inflammation that is causing pressure to be placed on the sciatic nerve. If nonsurgical (conservative) treatment is unsuccessful, then surgery may be recommended.  MEDICATION   If pain medication is necessary, then nonsteroidal anti-inflammatory medications, such as aspirin and ibuprofen, or other minor pain relievers, such as acetaminophen, are often recommended.  Do not take pain medication for 7 days before surgery.  Prescription pain relievers may be given if deemed necessary by your caregiver. Use only as directed and only as much as you need.  Corticosteroid injections may be given  by your caregiver. These injections should be reserved for the most serious cases, because they may only be given a certain number of times. HEAT AND COLD:   Cold treatment (icing) relieves pain and reduces inflammation. Cold treatment should be applied for 10 to 15 minutes every 2 to 3 hours for inflammation and pain and immediately after any activity that aggravates your symptoms. Use ice packs or massage the area with a piece of ice (ice massage).  Heat treatment may be used prior to performing the stretching and strengthening activities prescribed by your caregiver, physical therapist, or athletic trainer. Use a heat pack or soak the injury in warm water. SEEK IMMEDIATE MEDICAL CARE IF:  Treatment seems to offer no benefit, or the condition worsens.  Any medications produce adverse side effects. EXERCISES RANGE OF MOTION (ROM) AND STRETCHING EXERCISES - Piriformis Syndrome These exercises may help you when beginning to rehabilitate your injury. Your symptoms may resolve with or without further involvement from your physician, physical therapist, or athletic trainer. While completing these exercises, remember:   Restoring tissue flexibility helps normal motion to return to the joints. This allows healthier, less painful movement and activity.  An effective stretch should be held for at least 30 seconds.  A stretch should never be painful. You should only feel a gentle lengthening or release in the stretched tissue. STRETCH - Hip Rotators  Lie on your back on a firm surface. Grasp your right / left knee with your right / left hand and your ankle with your opposite hand.  Keeping your hips and shoulders firmly planted, gently pull your right / left knee and rotate your lower leg toward your opposite shoulder until you feel a stretch in your buttocks.  STRETCH - Iliotibial Band  On the floor or bed, lie on your side so your right / left leg is on top. Bend your knee and grab your  ankle.  Slowly bring your knee back so that your thigh is in line with your trunk. Keep your heel at your buttocks and gently arch your back so your head, shoulders, and hips line up.  Slowly lower your leg so that your knee approaches the floor/bed until you feel a gentle stretch on the outside of your right / left thigh. If you do not feel a stretch and your knee will not fall farther, place the heel of your opposite foot on top of your knee and pull your thigh down farther.  STRENGTHENING EXERCISES - Piriformis Syndrome  These are some of the caregiver again or until your symptoms are resolved. Remember:   Strong muscles with good endurance tolerate stress better.  Do the exercises as initially prescribed by your caregiver. Progress slowly with each exercise, gradually increasing the number of repetitions and weight used under their guidance. STRENGTH - Hip Abductors, Straight Leg Raises Be aware of your form throughout the entire exercise so that you exercise the correct muscles. Sloppy form  means that you are not strengthening the correct muscles.  Lie on your side so that your head, shoulders, knee, and hip line up. You may bend your lower knee to help maintain your balance. Your right / left leg should be on top.  Roll your hips slightly forward, so that your hips are stacked directly over each other and your right / left knee is facing forward.  Lift your top leg up 4-6 inches, leading with your heel. Be sure that your foot does not drift forward or that your knee does not roll toward the ceiling.  Hold this position for 5-6seconds. You should feel the muscles in your outer hip lifting (you may not notice this until your leg begins to tire).  Slowly lower your leg to the starting position. Allow the muscles to fully relax before beginning the next repetition.  STRENGTH - Hip Abductors, Quadruped  On a firm, lightly padded surface, position yourself on your hands and knees. Your  hands should be directly below your shoulders and your knees should be directly below your hips.  Keeping your right / left knee bent, lift your leg out to the side. Keep your legs level and in line with your shoulders.  Position yourself on your hands and knees.  Hold for __________ seconds.  Keeping your trunk steady and your hips level, slowly lower your leg to the starting position.  STRENGTH - Hip Abductors, Standing  Tie one end of a rubber exercise band/tubing to a secure surface (table, pole) and tie a loop at the other end.  Place the loop around your right / left ankle. Keeping your ankle with the band directly opposite of the secured end, step away until there is tension in the tube/band.  Hold onto a chair as needed for balance.  Keeping your back upright, your shoulders over your hips, and your toes pointing forward, lift your right / left leg out to your side. Be sure to lift your leg with your hip muscles. Do not "throw" your leg or tip your body to lift your leg.  Slowly and with control, return to the starting position.  Document Released: 06/08/2005 Document Revised: 10/23/2013 Document Reviewed: 09/20/2008 [facility] Patient Information 2015 [place], [place]. This information is not intended to replace advice given to you by your health care provider. Make sure you discuss any questions you have with your health care provider.
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
## 2015-02-07 NOTE — ED Notes (Signed)
C/o lower back pain x 2 days-hx of same

## 2015-07-16 ENCOUNTER — Emergency Department (HOSPITAL_BASED_OUTPATIENT_CLINIC_OR_DEPARTMENT_OTHER): Payer: Managed Care, Other (non HMO)

## 2015-07-16 ENCOUNTER — Emergency Department (HOSPITAL_BASED_OUTPATIENT_CLINIC_OR_DEPARTMENT_OTHER)
Admission: EM | Admit: 2015-07-16 | Discharge: 2015-07-16 | Disposition: A | Discharge status: Home / Self Care | Payer: Managed Care, Other (non HMO) | Attending: Emergency Medicine | Admitting: Emergency Medicine

## 2015-07-16 ENCOUNTER — Encounter (HOSPITAL_BASED_OUTPATIENT_CLINIC_OR_DEPARTMENT_OTHER): Payer: Self-pay | Admitting: Emergency Medicine

## 2015-07-16 DIAGNOSIS — J019 Acute sinusitis, unspecified: Secondary | ICD-10-CM | POA: Diagnosis not present

## 2015-07-16 DIAGNOSIS — R000 Tachycardia, unspecified: Secondary | ICD-10-CM | POA: Insufficient documentation

## 2015-07-16 DIAGNOSIS — Z8639 Personal history of other endocrine, nutritional and metabolic disease: Secondary | ICD-10-CM | POA: Insufficient documentation

## 2015-07-16 DIAGNOSIS — H748X3 Other specified disorders of middle ear and mastoid, bilateral: Secondary | ICD-10-CM | POA: Insufficient documentation

## 2015-07-16 DIAGNOSIS — I10 Essential (primary) hypertension: Secondary | ICD-10-CM | POA: Diagnosis not present

## 2015-07-16 DIAGNOSIS — J069 Acute upper respiratory infection, unspecified: Secondary | ICD-10-CM | POA: Diagnosis not present

## 2015-07-16 DIAGNOSIS — R52 Pain, unspecified: Secondary | ICD-10-CM | POA: Diagnosis present

## 2015-07-16 MED ORDER — HYDROCODONE-HOMATROPINE 5-1.5 MG/5ML PO SYRP: 5.0000 mL | ORAL_SOLUTION | Freq: Four times a day (QID) | ORAL | Status: DC | PRN

## 2015-07-16 MED ORDER — AZITHROMYCIN 250 MG PO TABS
250.0000 mg | ORAL_TABLET | Freq: Every day | ORAL | Status: DC
Start: 2015-07-16 — End: 2019-10-01

## 2015-07-16 MED FILL — AZITHROMYCIN 250 MG TABLET: 250 | 5 days supply | Qty: 6 | Fill #0

## 2015-07-16 MED FILL — HYDROCODONE-HOMATROPINE SYR: 5-1.5 | 6 days supply | Qty: 120 | Fill #0

## 2015-07-16 NOTE — ED Notes (Signed)
Pt with flu like illness x 3 weeks, cough, body aches, no fever, occasional productive cough with greenish phlem
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
## 2017-07-14 DIAGNOSIS — F329 Major depressive disorder, single episode, unspecified: Secondary | ICD-10-CM | POA: Diagnosis not present

## 2017-07-14 DIAGNOSIS — F419 Anxiety disorder, unspecified: Secondary | ICD-10-CM | POA: Diagnosis not present

## 2017-07-14 DIAGNOSIS — R945 Abnormal results of liver function studies: Secondary | ICD-10-CM | POA: Diagnosis not present

## 2017-07-14 DIAGNOSIS — I10 Essential (primary) hypertension: Secondary | ICD-10-CM | POA: Diagnosis not present

## 2017-09-16 DIAGNOSIS — R748 Abnormal levels of other serum enzymes: Secondary | ICD-10-CM | POA: Diagnosis not present

## 2017-09-16 DIAGNOSIS — Z9109 Other allergy status, other than to drugs and biological substances: Secondary | ICD-10-CM | POA: Diagnosis not present

## 2017-09-16 DIAGNOSIS — I10 Essential (primary) hypertension: Secondary | ICD-10-CM | POA: Diagnosis not present

## 2017-09-16 DIAGNOSIS — F419 Anxiety disorder, unspecified: Secondary | ICD-10-CM | POA: Diagnosis not present

## 2018-01-21 DIAGNOSIS — R748 Abnormal levels of other serum enzymes: Secondary | ICD-10-CM | POA: Diagnosis not present

## 2018-01-21 DIAGNOSIS — I10 Essential (primary) hypertension: Secondary | ICD-10-CM | POA: Diagnosis not present

## 2018-01-25 DIAGNOSIS — I10 Essential (primary) hypertension: Secondary | ICD-10-CM | POA: Diagnosis not present

## 2018-01-25 DIAGNOSIS — F419 Anxiety disorder, unspecified: Secondary | ICD-10-CM | POA: Diagnosis not present

## 2018-07-21 ENCOUNTER — Other Ambulatory Visit (HOSPITAL_BASED_OUTPATIENT_CLINIC_OR_DEPARTMENT_OTHER): Payer: Self-pay | Admitting: Family Medicine

## 2018-07-21 DIAGNOSIS — N50811 Right testicular pain: Secondary | ICD-10-CM | POA: Diagnosis not present

## 2018-07-22 ENCOUNTER — Ambulatory Visit (HOSPITAL_BASED_OUTPATIENT_CLINIC_OR_DEPARTMENT_OTHER)
Admission: RE | Admit: 2018-07-22 | Discharge: 2018-07-22 | Disposition: A | Discharge status: Home / Self Care | Payer: BLUE CROSS/BLUE SHIELD | Source: Ambulatory Visit | Attending: Family Medicine | Admitting: Family Medicine

## 2018-07-22 DIAGNOSIS — N5089 Other specified disorders of the male genital organs: Secondary | ICD-10-CM | POA: Diagnosis not present

## 2018-07-22 DIAGNOSIS — N5082 Scrotal pain: Secondary | ICD-10-CM | POA: Diagnosis not present

## 2018-07-22 DIAGNOSIS — N50811 Right testicular pain: Secondary | ICD-10-CM | POA: Diagnosis not present

## 2018-08-01 DIAGNOSIS — Z23 Encounter for immunization: Secondary | ICD-10-CM | POA: Diagnosis not present

## 2018-08-01 DIAGNOSIS — F419 Anxiety disorder, unspecified: Secondary | ICD-10-CM | POA: Diagnosis not present

## 2018-08-01 DIAGNOSIS — Z0000 Encounter for general adult medical examination without abnormal findings: Secondary | ICD-10-CM | POA: Diagnosis not present

## 2018-08-01 DIAGNOSIS — Z13220 Encounter for screening for lipoid disorders: Secondary | ICD-10-CM | POA: Diagnosis not present

## 2018-08-01 DIAGNOSIS — I10 Essential (primary) hypertension: Secondary | ICD-10-CM | POA: Diagnosis not present

## 2019-01-31 DIAGNOSIS — F419 Anxiety disorder, unspecified: Secondary | ICD-10-CM | POA: Diagnosis not present

## 2019-01-31 DIAGNOSIS — F329 Major depressive disorder, single episode, unspecified: Secondary | ICD-10-CM | POA: Diagnosis not present

## 2019-01-31 DIAGNOSIS — I10 Essential (primary) hypertension: Secondary | ICD-10-CM | POA: Diagnosis not present

## 2019-05-09 DIAGNOSIS — S39012A Strain of muscle, fascia and tendon of lower back, initial encounter: Secondary | ICD-10-CM | POA: Diagnosis not present

## 2019-06-22 DIAGNOSIS — Z03818 Encounter for observation for suspected exposure to other biological agents ruled out: Secondary | ICD-10-CM | POA: Diagnosis not present

## 2019-06-22 DIAGNOSIS — R430 Anosmia: Secondary | ICD-10-CM | POA: Diagnosis not present

## 2019-08-09 DIAGNOSIS — Z1152 Encounter for screening for COVID-19: Secondary | ICD-10-CM | POA: Diagnosis not present

## 2019-08-09 DIAGNOSIS — A0839 Other viral enteritis: Secondary | ICD-10-CM | POA: Diagnosis not present

## 2019-08-09 DIAGNOSIS — I10 Essential (primary) hypertension: Secondary | ICD-10-CM | POA: Diagnosis not present

## 2019-09-24 ENCOUNTER — Other Ambulatory Visit: Payer: Self-pay

## 2019-09-24 ENCOUNTER — Emergency Department (HOSPITAL_BASED_OUTPATIENT_CLINIC_OR_DEPARTMENT_OTHER)
Admission: EM | Admit: 2019-09-24 | Discharge: 2019-09-25 | Disposition: A | Discharge status: Home / Self Care | Payer: BC Managed Care – PPO | Attending: Emergency Medicine | Admitting: Emergency Medicine

## 2019-09-24 ENCOUNTER — Emergency Department (HOSPITAL_BASED_OUTPATIENT_CLINIC_OR_DEPARTMENT_OTHER): Payer: BC Managed Care – PPO

## 2019-09-24 ENCOUNTER — Encounter (HOSPITAL_BASED_OUTPATIENT_CLINIC_OR_DEPARTMENT_OTHER): Payer: Self-pay | Admitting: Emergency Medicine

## 2019-09-24 DIAGNOSIS — R109 Unspecified abdominal pain: Secondary | ICD-10-CM | POA: Insufficient documentation

## 2019-09-24 DIAGNOSIS — M545 Low back pain, unspecified: Secondary | ICD-10-CM

## 2019-09-24 DIAGNOSIS — I10 Essential (primary) hypertension: Secondary | ICD-10-CM | POA: Diagnosis not present

## 2019-09-24 LAB — URINALYSIS, ROUTINE W REFLEX MICROSCOPIC
Bilirubin Urine: NEGATIVE
Glucose, UA: NEGATIVE mg/dL
Hgb urine dipstick: NEGATIVE
Ketones, ur: NEGATIVE mg/dL
Leukocytes,Ua: NEGATIVE
Nitrite: NEGATIVE
Protein, ur: NEGATIVE mg/dL
Specific Gravity, Urine: 1.02 (ref 1.005–1.030)
pH: 6 (ref 5.0–8.0)

## 2019-09-24 MED ORDER — HYDROCODONE-ACETAMINOPHEN 5-325 MG PO TABS
1.0000 | ORAL_TABLET | Freq: Once | ORAL | Status: AC
  Administered 2019-09-24: 1 via ORAL
  Filled 2019-09-24: qty 1

## 2019-09-24 NOTE — ED Triage Notes (Signed)
Pt arrives with left sided back pain/flank pain and decreased urination since yesterday.

## 2019-09-25 LAB — CBC WITH DIFFERENTIAL/PLATELET
Abs Immature Granulocytes: 0.02 10*3/uL (ref 0.00–0.07)
Basophils Absolute: 0.1 10*3/uL (ref 0.0–0.1)
Basophils Relative: 1 %
Eosinophils Absolute: 0.2 10*3/uL (ref 0.0–0.5)
Eosinophils Relative: 2 %
HCT: 43.4 % (ref 39.0–52.0)
Hemoglobin: 15.8 g/dL (ref 13.0–17.0)
Immature Granulocytes: 0 %
Lymphocytes Relative: 28 %
Lymphs Abs: 2.3 10*3/uL (ref 0.7–4.0)
MCH: 33.3 pg (ref 26.0–34.0)
MCHC: 36.4 g/dL — ABNORMAL HIGH (ref 30.0–36.0)
MCV: 91.6 fL (ref 80.0–100.0)
Monocytes Absolute: 0.7 10*3/uL (ref 0.1–1.0)
Monocytes Relative: 9 %
Neutro Abs: 4.9 10*3/uL (ref 1.7–7.7)
Neutrophils Relative %: 60 %
Platelets: 323 10*3/uL (ref 150–400)
RBC: 4.74 MIL/uL (ref 4.22–5.81)
RDW: 11.4 % — ABNORMAL LOW (ref 11.5–15.5)
WBC: 8.1 10*3/uL (ref 4.0–10.5)
nRBC: 0 % (ref 0.0–0.2)

## 2019-09-25 LAB — BASIC METABOLIC PANEL
Anion gap: 11 (ref 5–15)
BUN: 21 mg/dL — ABNORMAL HIGH (ref 6–20)
CO2: 25 mmol/L (ref 22–32)
Calcium: 9.7 mg/dL (ref 8.9–10.3)
Chloride: 98 mmol/L (ref 98–111)
Creatinine, Ser: 1.06 mg/dL (ref 0.61–1.24)
GFR calc Af Amer: >60 mL/min (ref >60)
GFR calc non Af Amer: >60 mL/min (ref >60)
Glucose, Bld: 106 mg/dL — ABNORMAL HIGH (ref 70–99)
Potassium: 3.8 mmol/L (ref 3.5–5.1)
Sodium: 134 mmol/L — ABNORMAL LOW (ref 135–145)

## 2019-09-25 LAB — D-DIMER, QUANTITATIVE (NOT AT ARMC): D-Dimer, Quant: 0.38 ug/mL-FEU (ref 0.00–0.50)

## 2019-09-25 NOTE — Discharge Instructions (Addendum)
You were seen today for back pain.  Your work-up today is reassuring.  Continue Tylenol or Motrin as needed for any pain or discomfort.  Monitor symptoms closely.  If you develop any new or worsening symptoms including fever, nausea, vomiting, weakness or bowel or bladder difficulty you should be reevaluated.

## 2019-09-25 NOTE — ED Provider Notes (Signed)
[facility] EMERGENCY DEPARTMENT Provider Note   CSN: [id number] Arrival date & time: 09/24/19  2032     History Chief Complaint  Patient presents with  . Back Pain    Kent Hall is a 34 y.o. male.  HPI     This is a 34 year old male with history of hypertension and hypercholesterolemia who presents with back pain.  Patient reports that he had acute onset of left back pain over the last couple days.  It is nonradiating.  It is sharp.  It does get better with some ibuprofen or Tylenol.  It does not seem to be worsened with movement or deep breath.  Patient denies any urinary symptoms, urinary frequency, urgency, hematuria.  Patient currently rates his pain at 5 out of 10.  Denies nausea, vomiting, chest pain, shortness of breath, abdominal pain.  Of note, he did discuss with nursing after my initial history taking that he has been in his car a lot recently and that may be it is worsened with some deep breaths.  Past Medical History:  Diagnosis Date  . Back pain   . High cholesterol   . Hypertension     There are no problems to display for this patient.   Past Surgical History:  Procedure Laterality Date  . foot surgery from [facility]    . VASECTOMY         No family history on file.  Social History   Tobacco Use  . Smoking status: Never Smoker  . Smokeless tobacco: Never Used  Substance Use Topics  . Alcohol use: Yes    Comment: daily - 2 drinks/day  . Drug use: No    Home Medications Prior to Admission medications   Medication Sig Start Date End Date Taking? Authorizing Provider  azithromycin (ZITHROMAX) 250 MG tablet Take 1 tablet (250 mg total) by mouth daily. Take first 2 tablets together, then 1 every day until finished. 07/16/15   Gwyneth Sprout, MD  HYDROcodone-homatropine (HYCODAN) 5-1.5 MG/5ML syrup Take 5 mLs by mouth every 6 (six) hours as needed for cough. 07/16/15   Gwyneth Sprout, MD    Allergies    Patient has no known  allergies.  Review of Systems   Review of Systems  Constitutional: Negative for fever.  Respiratory: Negative for shortness of breath.   Cardiovascular: Negative for chest pain.  Gastrointestinal: Negative for abdominal pain, nausea and vomiting.  Genitourinary: Positive for flank pain.  Musculoskeletal: Negative for back pain.  Neurological: Negative for headaches.  All other systems reviewed and are negative.   Physical Exam Updated Vital Signs BP 138/89 (BP Location: Right Arm)   Pulse (!) 111   Temp 98.7 F (37.1 C)   Resp 18   Ht 1.778 m (5\' 10" )   Wt 120.2 kg   SpO2 100%   BMI 38.02 kg/m   Physical Exam Vitals and nursing note reviewed.  Constitutional:      Appearance: He is well-developed.     Comments: Overweight, no acute distress  HENT:     Head: Normocephalic and atraumatic.     Mouth/Throat:     Mouth: Mucous membranes are moist.  Eyes:     Pupils: Pupils are equal, round, and reactive to light.  Cardiovascular:     Rate and Rhythm: Normal rate and regular rhythm.     Heart sounds: Normal heart sounds. No murmur.  Pulmonary:     Effort: Pulmonary effort is normal. No respiratory distress.  Breath sounds: Normal breath sounds. No wheezing.  Abdominal:     General: Bowel sounds are normal.     Palpations: Abdomen is soft.     Tenderness: There is no abdominal tenderness. There is no right CVA tenderness, left CVA tenderness or rebound.  Musculoskeletal:     Cervical back: Neck supple.     Comments: No midline tenderness to palpation, step-off, deformity, no paraspinous muscle tenderness  Lymphadenopathy:     Cervical: No cervical adenopathy.  Skin:    General: Skin is warm and dry.  Neurological:     Mental Status: He is alert and oriented to person, place, and time.  Psychiatric:        Mood and Affect: Mood normal.     ED Results / Procedures / Treatments   Labs (all labs ordered are listed, but only abnormal results are displayed) Labs  Reviewed  BASIC METABOLIC PANEL - Abnormal; Notable for the following components:      Result Value   Sodium 134 (*)    Glucose, Bld 106 (*)    BUN 21 (*)    All other components within normal limits  CBC WITH DIFFERENTIAL/PLATELET - Abnormal; Notable for the following components:   MCHC 36.4 (*)    RDW 11.4 (*)    All other components within normal limits  URINALYSIS, ROUTINE W REFLEX MICROSCOPIC  D-DIMER, QUANTITATIVE (NOT AT [facility])    EKG None  Radiology CT Renal Stone Study  Result Date: 09/24/2019 CLINICAL DATA:  Flank pain. Left-sided back and flank pain. EXAM: CT ABDOMEN AND PELVIS WITHOUT CONTRAST TECHNIQUE: Multidetector CT imaging of the abdomen and pelvis was performed following the standard protocol without IV contrast. COMPARISON:  03/16/2014 FINDINGS: Lower chest: The lung bases are clear. The heart size is normal. Hepatobiliary: There is borderline hepatic steatosis. Normal gallbladder.There is no biliary ductal dilation. Pancreas: Normal contours without ductal dilatation. No peripancreatic fluid collection. Spleen: Unremarkable. Adrenals/Urinary Tract: --Adrenal glands: Unremarkable. --Right kidney/ureter: No hydronephrosis or radiopaque kidney stones. --Left kidney/ureter: No hydronephrosis or radiopaque kidney stones. --Urinary bladder: Unremarkable. Stomach/Bowel: --Stomach/Duodenum: No hiatal hernia or other gastric abnormality. Normal duodenal course and caliber. --Small bowel: Unremarkable. --Colon: Unremarkable. --Appendix: Normal. Vascular/Lymphatic: Normal course and caliber of the major abdominal vessels. --No retroperitoneal lymphadenopathy. --No mesenteric lymphadenopathy. --No pelvic or inguinal lymphadenopathy. Reproductive: Unremarkable Other: No ascites or free air. The abdominal wall is normal. Musculoskeletal. No acute displaced fractures. IMPRESSION: 1. Borderline hepatic steatosis. 2. No radiopaque kidney stones or hydronephrosis. 3. Normal appendix.  Electronically Signed   By: Constance Holster M.D.   On: 09/24/2019 23:45    Procedures Procedures (including critical care time)  Medications Ordered in ED Medications  HYDROcodone-acetaminophen (NORCO/VICODIN) 5-325 MG per tablet 1 tablet (1 tablet Oral Given 09/24/19 2330)    ED Course  I have reviewed the triage vital signs and the nursing notes.  Pertinent labs & imaging results that were available during my care of the patient were reviewed by me and considered in my medical decision making (see chart for details).    MDM Rules/Calculators/A&P                       Patient presents with back pain.  Patient states he has a history of back pain but this feels different.  It is nonreproducible on exam.  Nothing seems to make it worse.  It is improved with ibuprofen and Tylenol.  He is overall nontoxic and vital signs are notable  for pulse rate of 111.  Considerations include urinary tract infection, kidney stone, musculoskeletal, less likely PE.  Patient does report recent travel.  Labs sent.  CT stone study obtained and no evidence of kidney stone.  Labs are unremarkable including D-dimer which screens for PE.  Patient remains clinically stable and well-appearing on exam.  Improved with Norco.  Recommend ongoing treatment with Tylenol Motrin at home as needed.  After history, exam, and medical workup I feel the patient has been appropriately medically screened and is safe for discharge home. Pertinent diagnoses were discussed with the patient. Patient was given return precautions.   Final Clinical Impression(s) / ED Diagnoses Final diagnoses:  Acute left-sided low back pain without sciatica    Rx / DC Orders ED Discharge Orders    None       Ante Arredondo, Mayer Masker, MD 09/25/19 0045

## 2019-10-01 ENCOUNTER — Emergency Department (HOSPITAL_BASED_OUTPATIENT_CLINIC_OR_DEPARTMENT_OTHER)
Admission: EM | Admit: 2019-10-01 | Discharge: 2019-10-01 | Disposition: A | Discharge status: Home / Self Care | Payer: BC Managed Care – PPO | Attending: Emergency Medicine | Admitting: Emergency Medicine

## 2019-10-01 ENCOUNTER — Encounter (HOSPITAL_BASED_OUTPATIENT_CLINIC_OR_DEPARTMENT_OTHER): Payer: Self-pay | Admitting: *Deleted

## 2019-10-01 ENCOUNTER — Other Ambulatory Visit: Payer: Self-pay

## 2019-10-01 DIAGNOSIS — B029 Zoster without complications: Secondary | ICD-10-CM | POA: Diagnosis not present

## 2019-10-01 DIAGNOSIS — Z79899 Other long term (current) drug therapy: Secondary | ICD-10-CM | POA: Diagnosis not present

## 2019-10-01 DIAGNOSIS — R21 Rash and other nonspecific skin eruption: Secondary | ICD-10-CM | POA: Diagnosis not present

## 2019-10-01 DIAGNOSIS — I10 Essential (primary) hypertension: Secondary | ICD-10-CM | POA: Diagnosis not present

## 2019-10-01 DIAGNOSIS — E782 Mixed hyperlipidemia: Secondary | ICD-10-CM | POA: Insufficient documentation

## 2019-10-01 MED ORDER — VALACYCLOVIR HCL 1 G PO TABS: 1000.0000 mg | ORAL_TABLET | Freq: Three times a day (TID) | ORAL | 0 refills | Status: AC

## 2019-10-01 MED ORDER — VALACYCLOVIR HCL 500 MG PO TABS
1000.0000 mg | ORAL_TABLET | Freq: Once | ORAL | Status: AC
  Administered 2019-10-01: 1000 mg via ORAL
  Filled 2019-10-01: qty 2

## 2019-10-01 MED ORDER — VALACYCLOVIR HCL 1 G PO TABS: 1000.0000 mg | ORAL_TABLET | Freq: Three times a day (TID) | ORAL | 0 refills | Status: DC

## 2019-10-01 MED ORDER — OXYCODONE-ACETAMINOPHEN 5-325 MG PO TABS: 1.0000 | ORAL_TABLET | ORAL | 0 refills | Status: DC | PRN

## 2019-10-01 NOTE — ED Provider Notes (Signed)
[facility] EMERGENCY DEPARTMENT Provider Note   CSN: [id number] Arrival date & time: 10/01/19  0421     History Chief Complaint  Patient presents with  . rash to left flank    Kent Hall is a 34 y.o. male.  Patient presents to the emergency department with a painful rash on his left flank.  Patient has pain in the area for approximately a week.  He was seen in the ER initially when pain started.        Past Medical History:  Diagnosis Date  . Back pain   . High cholesterol   . Hypertension     There are no problems to display for this patient.   Past Surgical History:  Procedure Laterality Date  . foot surgery from [facility]    . VASECTOMY         No family history on file.  Social History   Tobacco Use  . Smoking status: Never Smoker  . Smokeless tobacco: Never Used  Substance Use Topics  . Alcohol use: Yes    Comment: daily - 2 drinks/day  . Drug use: No    Home Medications Prior to Admission medications   Medication Sig Start Date End Date Taking? Authorizing Provider  Escitalopram Oxalate (LEXAPRO PO) Take by mouth.   Yes [provider]  oxyCODONE-acetaminophen (PERCOCET) 5-325 MG tablet Take 1 tablet by mouth every 4 (four) hours as needed. 10/01/19   Gilda Crease, MD  valACYclovir (VALTREX) 1000 MG tablet Take 1 tablet (1,000 mg total) by mouth 3 (three) times daily. 10/01/19   Gilda Crease, MD    Allergies    Patient has no known allergies.  Review of Systems   Review of Systems  Skin: Positive for rash.  All other systems reviewed and are negative.   Physical Exam Updated Vital Signs Ht 5\' 10"  (1.778 m)   BMI 38.02 kg/m   Physical Exam Vitals and nursing note reviewed.  Constitutional:      General: He is not in acute distress.    Appearance: Normal appearance. He is well-developed.  HENT:     Head: Normocephalic and atraumatic.     Right Ear: Hearing normal.     Left Ear: Hearing normal.       Nose: Nose normal.  Eyes:     Conjunctiva/sclera: Conjunctivae normal.     Pupils: Pupils are equal, round, and reactive to light.  Cardiovascular:     Rate and Rhythm: Regular rhythm.     Heart sounds: S1 normal and S2 normal. No murmur. No friction rub. No gallop.   Pulmonary:     Effort: Pulmonary effort is normal. No respiratory distress.     Breath sounds: Normal breath sounds.  Chest:     Chest wall: No tenderness.  Abdominal:     General: Bowel sounds are normal.     Palpations: Abdomen is soft.     Tenderness: There is no abdominal tenderness. There is no guarding or rebound. Negative signs include Murphy's sign and McBurney's sign.     Hernia: No hernia is present.  Musculoskeletal:        General: Normal range of motion.     Cervical back: Normal range of motion and neck supple.  Skin:    General: Skin is warm and dry.     Findings: Rash (Raised erythematous and tender patchy rash along dermatome of left flank) present.  Neurological:     Mental Status: He is  alert and oriented to person, place, and time.     GCS: GCS eye subscore is 4. GCS verbal subscore is 5. GCS motor subscore is 6.     Cranial Nerves: No cranial nerve deficit.     Sensory: No sensory deficit.     Coordination: Coordination normal.  Psychiatric:        Speech: Speech normal.        Behavior: Behavior normal.        Thought Content: Thought content normal.     ED Results / Procedures / Treatments   Labs (all labs ordered are listed, but only abnormal results are displayed) Labs Reviewed - No data to display  EKG None  Radiology No results found.  Procedures Procedures (including critical care time)  Medications Ordered in ED Medications  valACYclovir (VALTREX) tablet 1,000 mg (has no administration in time range)    ED Course  I have reviewed the triage vital signs and the nursing notes.  Pertinent labs & imaging results that were available during my care of the patient were  reviewed by me and considered in my medical decision making (see chart for details).    MDM Rules/Calculators/A&P                       Final Clinical Impression(s) / ED Diagnoses Final diagnoses:  Herpes zoster without complication    Rx / DC Orders ED Discharge Orders         Ordered    valACYclovir (VALTREX) 1000 MG tablet  3 times daily     10/01/19 0451    oxyCODONE-acetaminophen (PERCOCET) 5-325 MG tablet  Every 4 hours PRN     10/01/19 0451           Orpah Greek, MD 10/01/19 [phone]

## 2019-10-01 NOTE — ED Triage Notes (Signed)
Pt was seen on 4-4 with left sided back pain. States on Tuesday he noticed a "red spot" states for the past 2 days he has had a rash to left flank area. This appears to be a shingles rash.

## 2019-10-12 DIAGNOSIS — E782 Mixed hyperlipidemia: Secondary | ICD-10-CM | POA: Diagnosis not present

## 2019-10-12 DIAGNOSIS — Z0000 Encounter for general adult medical examination without abnormal findings: Secondary | ICD-10-CM | POA: Diagnosis not present

## 2019-10-12 DIAGNOSIS — I10 Essential (primary) hypertension: Secondary | ICD-10-CM | POA: Diagnosis not present

## 2019-10-12 DIAGNOSIS — F3341 Major depressive disorder, recurrent, in partial remission: Secondary | ICD-10-CM | POA: Diagnosis not present

## 2019-10-12 DIAGNOSIS — Z5181 Encounter for therapeutic drug level monitoring: Secondary | ICD-10-CM | POA: Diagnosis not present

## 2021-09-17 IMAGING — CT CT RENAL STONE PROTOCOL
2 of 4 series · 17 of 46 positions shown, 19 images · non-contrast
Comparison: 03/16/2014

CLINICAL DATA: Flank pain. Left-sided back and flank pain.

EXAM:
CT ABDOMEN AND PELVIS WITHOUT CONTRAST
TECHNIQUE: Multidetector CT imaging of the abdomen and pelvis was performed
following the standard protocol without IV contrast.

[Series 2: axial st · axial · 0.88mm/px · z∈[-311,+129]mm · 14 of 97 slices shown, 16 images]
[im 5/97  soft-tissue]
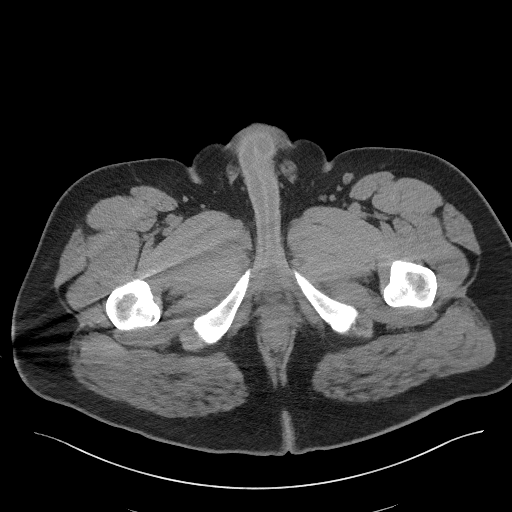
[im 5/97  bone]
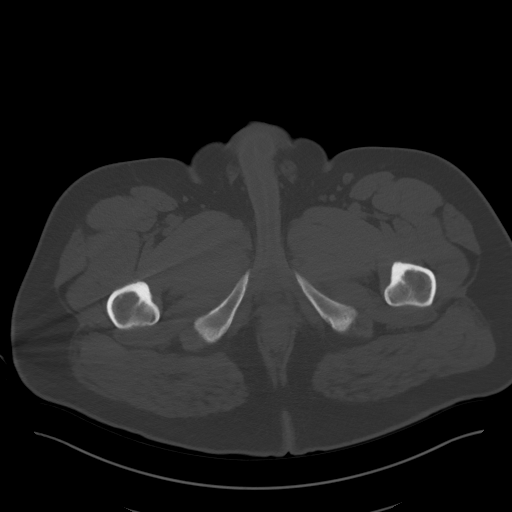
[im 13/97  soft-tissue]
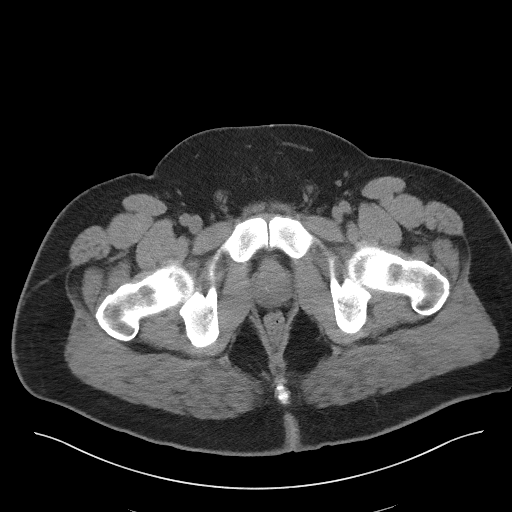
[im 21/97  soft-tissue]
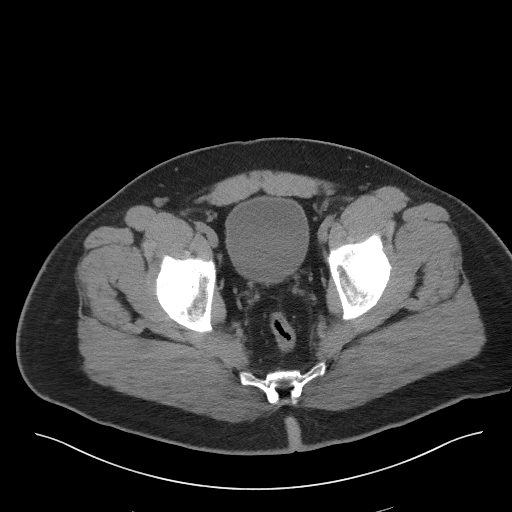
[im 25/97  soft-tissue]
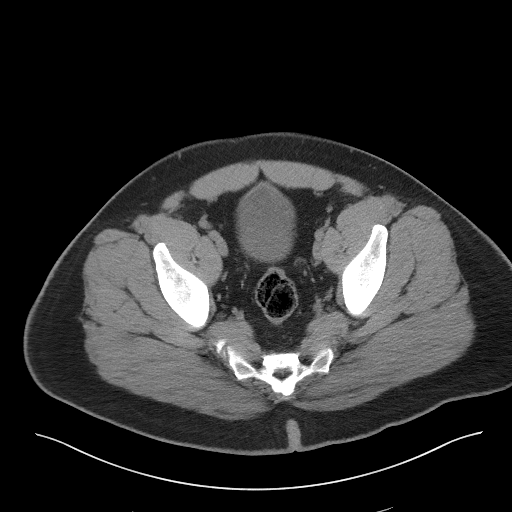
[im 33/97  soft-tissue]
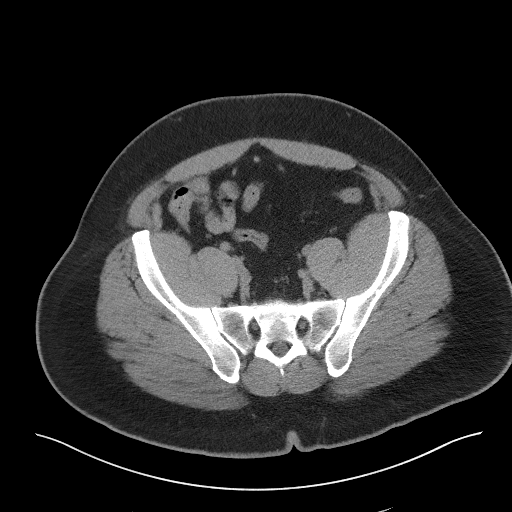
[im 41/97  soft-tissue]
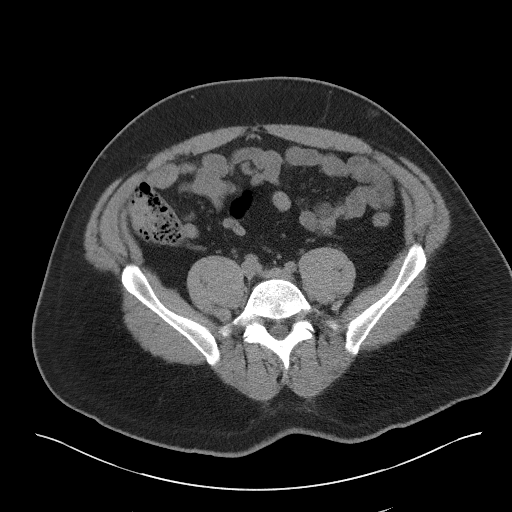
[im 45/97  soft-tissue]
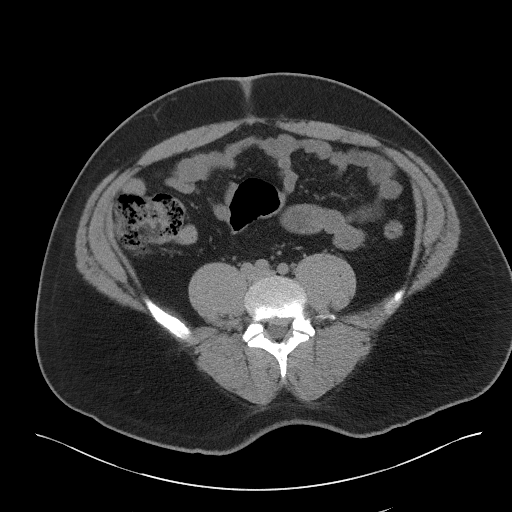
[im 53/97  soft-tissue]
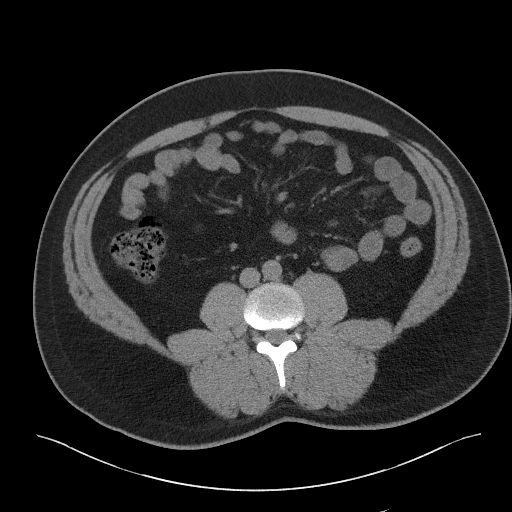
[im 57/97  soft-tissue]
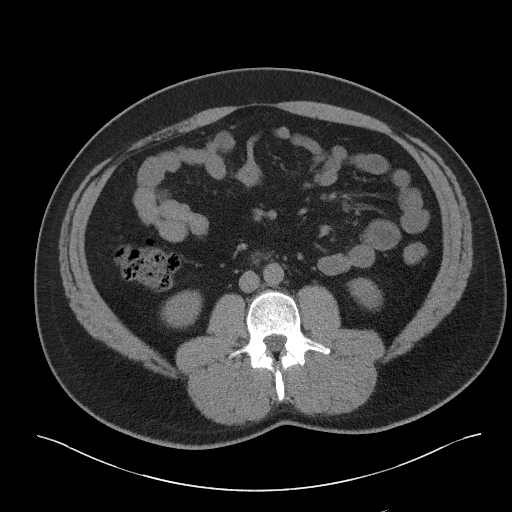
[im 57/97  bone]
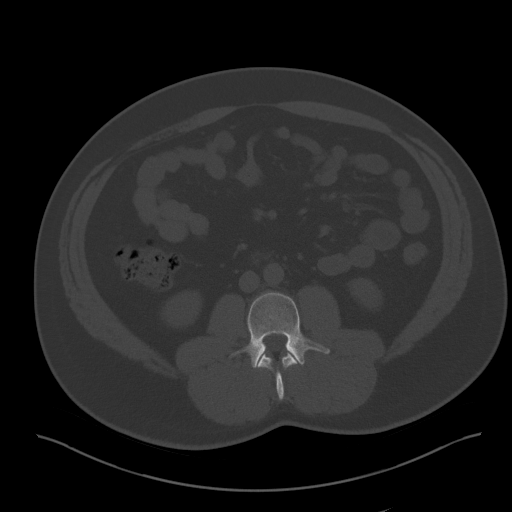
[im 65/97  soft-tissue]
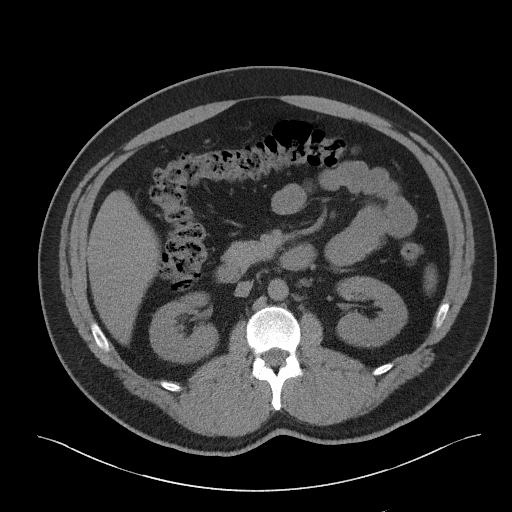
[im 73/97  soft-tissue]
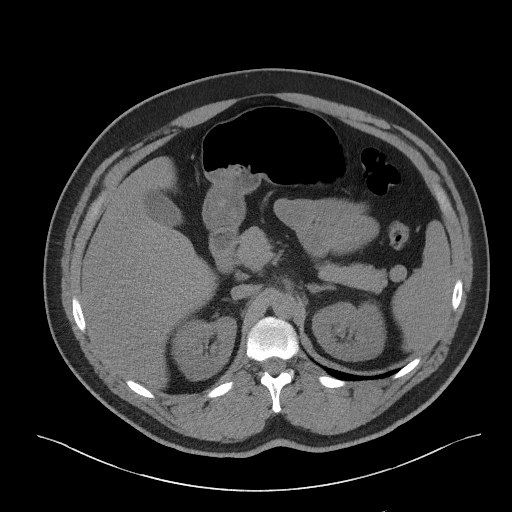
[im 77/97  soft-tissue]
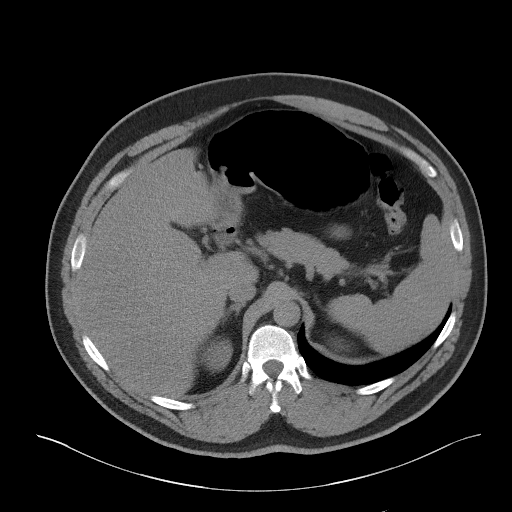
[im 85/97  soft-tissue]
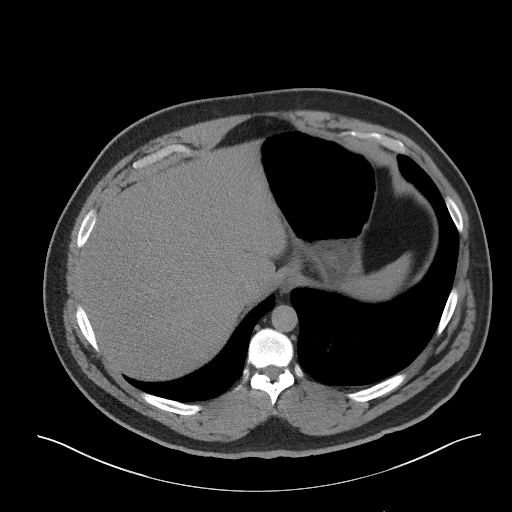
[im 93/97  soft-tissue]
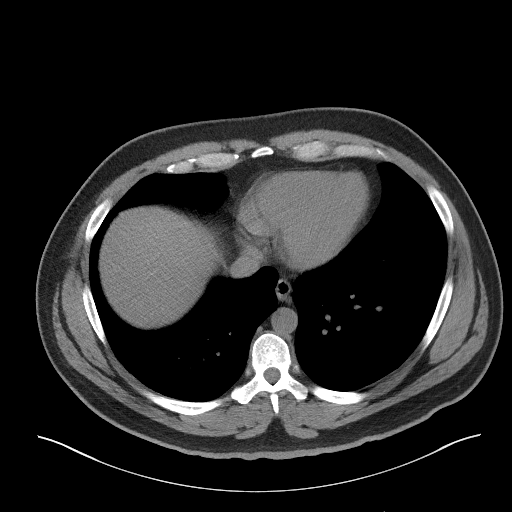

[Series 4: coronal st · coronal · 0.94mm/px · 3 of 103 slices shown]
[im 35/103  soft-tissue]
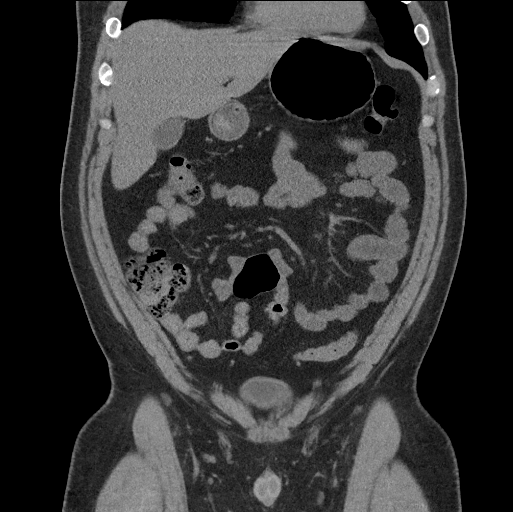
[im 46/103  soft-tissue]
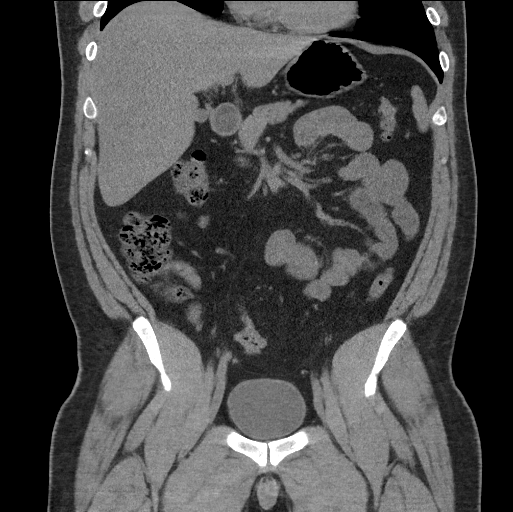
[im 57/103  soft-tissue]
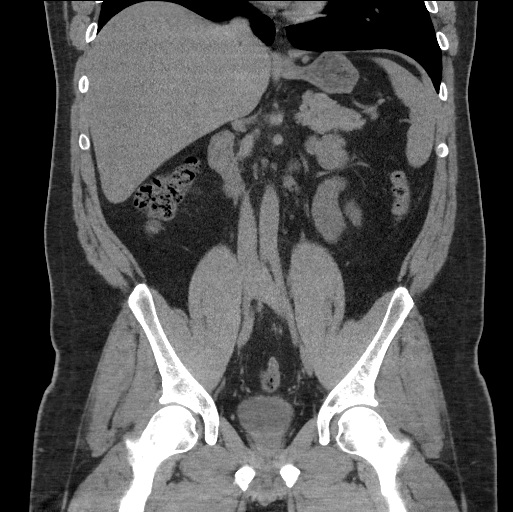

[17 of 46 positions shown; findings below may reference images not displayed]

FINDINGS: Lower chest: The lung bases are clear. The heart size is normal.

Hepatobiliary: There is borderline hepatic steatosis. Normal
gallbladder.There is no biliary ductal dilation.

Pancreas: Normal contours without ductal dilatation. No
peripancreatic fluid collection.

Spleen: Unremarkable.

Adrenals/Urinary Tract:

--Adrenal glands: Unremarkable.

--Right kidney/ureter: No hydronephrosis or radiopaque kidney
stones.

--Left kidney/ureter: No hydronephrosis or radiopaque kidney stones.

--Urinary bladder: Unremarkable.

Stomach/Bowel:

--Stomach/Duodenum: No hiatal hernia or other gastric abnormality.
Normal duodenal course and caliber.

--Small bowel: Unremarkable.

--Colon: Unremarkable.

--Appendix: Normal.

Vascular/Lymphatic: Normal course and caliber of the major abdominal
vessels.

--No retroperitoneal lymphadenopathy.

--No mesenteric lymphadenopathy.

--No pelvic or inguinal lymphadenopathy.

Reproductive: Unremarkable

Other: No ascites or free air. The abdominal wall is normal.

Musculoskeletal. No acute displaced fractures.
IMPRESSION: 1. Borderline hepatic steatosis.
2. No radiopaque kidney stones or hydronephrosis.
3. Normal appendix.

## 2022-11-28 ENCOUNTER — Emergency Department (HOSPITAL_BASED_OUTPATIENT_CLINIC_OR_DEPARTMENT_OTHER): Payer: BC Managed Care – PPO

## 2022-11-28 ENCOUNTER — Encounter (HOSPITAL_BASED_OUTPATIENT_CLINIC_OR_DEPARTMENT_OTHER): Payer: Self-pay

## 2022-11-28 ENCOUNTER — Emergency Department (HOSPITAL_BASED_OUTPATIENT_CLINIC_OR_DEPARTMENT_OTHER)
Admission: EM | Admit: 2022-11-28 | Discharge: 2022-11-28 | Disposition: A | Discharge status: Home / Self Care | Payer: BC Managed Care – PPO | Attending: Emergency Medicine | Admitting: Emergency Medicine

## 2022-11-28 ENCOUNTER — Other Ambulatory Visit: Payer: Self-pay

## 2022-11-28 DIAGNOSIS — E876 Hypokalemia: Secondary | ICD-10-CM | POA: Insufficient documentation

## 2022-11-28 DIAGNOSIS — R112 Nausea with vomiting, unspecified: Secondary | ICD-10-CM

## 2022-11-28 DIAGNOSIS — E860 Dehydration: Secondary | ICD-10-CM

## 2022-11-28 LAB — CBC
HCT: 48.2 % (ref 39.0–52.0)
Hemoglobin: 17.8 g/dL — ABNORMAL HIGH (ref 13.0–17.0)
MCH: 30.7 pg (ref 26.0–34.0)
MCHC: 36.9 g/dL — ABNORMAL HIGH (ref 30.0–36.0)
MCV: 83.1 fL (ref 80.0–100.0)
Platelets: 472 10*3/uL — ABNORMAL HIGH (ref 150–400)
RBC: 5.8 MIL/uL (ref 4.22–5.81)
RDW: 11.8 % (ref 11.5–15.5)
WBC: 10.1 10*3/uL (ref 4.0–10.5)
nRBC: 0 % (ref 0.0–0.2)

## 2022-11-28 LAB — RAPID URINE DRUG SCREEN, HOSP PERFORMED
Amphetamines: NOT DETECTED
Barbiturates: NOT DETECTED
Benzodiazepines: POSITIVE — AB
Cocaine: NOT DETECTED
Opiates: NOT DETECTED
Tetrahydrocannabinol: POSITIVE — AB

## 2022-11-28 LAB — URINALYSIS, ROUTINE W REFLEX MICROSCOPIC
Bilirubin Urine: NEGATIVE
Glucose, UA: NEGATIVE mg/dL
Hgb urine dipstick: NEGATIVE
Ketones, ur: NEGATIVE mg/dL
Leukocytes,Ua: NEGATIVE
Nitrite: NEGATIVE
Protein, ur: NEGATIVE mg/dL
Specific Gravity, Urine: 1.015 (ref 1.005–1.030)
pH: 7 (ref 5.0–8.0)

## 2022-11-28 LAB — COMPREHENSIVE METABOLIC PANEL
ALT: 30 U/L (ref 0–44)
AST: 26 U/L (ref 15–41)
Albumin: 5 g/dL (ref 3.5–5.0)
Alkaline Phosphatase: 75 U/L (ref 38–126)
Anion gap: 15 (ref 5–15)
BUN: 28 mg/dL — ABNORMAL HIGH (ref 6–20)
CO2: 24 mmol/L (ref 22–32)
Calcium: 9.5 mg/dL (ref 8.9–10.3)
Chloride: 93 mmol/L — ABNORMAL LOW (ref 98–111)
Creatinine, Ser: 1.2 mg/dL (ref 0.61–1.24)
GFR, Estimated: >60 mL/min (ref >60)
Glucose, Bld: 133 mg/dL — ABNORMAL HIGH (ref 70–99)
Potassium: 3.3 mmol/L — ABNORMAL LOW (ref 3.5–5.1)
Sodium: 132 mmol/L — ABNORMAL LOW (ref 135–145)
Total Bilirubin: 1.6 mg/dL — ABNORMAL HIGH (ref 0.3–1.2)
Total Protein: 8.9 g/dL — ABNORMAL HIGH (ref 6.5–8.1)

## 2022-11-28 LAB — LIPASE, BLOOD: Lipase: 69 U/L — ABNORMAL HIGH (ref 11–51)

## 2022-11-28 MED ORDER — PROMETHAZINE HCL 25 MG/ML IJ SOLN
INTRAMUSCULAR | Status: AC
  Filled 2022-11-28: qty 1

## 2022-11-28 MED ORDER — SODIUM CHLORIDE 0.9 % IV SOLN
12.5000 mg | Freq: Once | INTRAVENOUS | Status: AC
  Administered 2022-11-28: 12.5 mg via INTRAVENOUS
  Filled 2022-11-28: qty 0.5

## 2022-11-28 MED ORDER — LACTATED RINGERS IV BOLUS
1000.0000 mL | Freq: Once | INTRAVENOUS | Status: AC
  Administered 2022-11-28: 1000 mL via INTRAVENOUS

## 2022-11-28 MED ORDER — SODIUM CHLORIDE 0.9 % IV BOLUS
1000.0000 mL | Freq: Once | INTRAVENOUS | Status: AC
  Administered 2022-11-28: 1000 mL via INTRAVENOUS

## 2022-11-28 MED ORDER — ONDANSETRON HCL 4 MG/2ML IJ SOLN
4.0000 mg | Freq: Once | INTRAMUSCULAR | Status: AC
  Administered 2022-11-28: 4 mg via INTRAVENOUS
  Filled 2022-11-28: qty 2

## 2022-11-28 MED ORDER — IOHEXOL 300 MG/ML  SOLN
100.0000 mL | Freq: Once | INTRAMUSCULAR | Status: AC | PRN
  Administered 2022-11-28: 100 mL via INTRAVENOUS

## 2022-11-28 MED ORDER — DIAZEPAM 5 MG/ML IJ SOLN
2.5000 mg | Freq: Once | INTRAMUSCULAR | Status: AC
  Administered 2022-11-28: 2.5 mg via INTRAVENOUS
  Filled 2022-11-28: qty 2

## 2022-11-28 MED ORDER — METOCLOPRAMIDE HCL 5 MG/ML IJ SOLN
10.0000 mg | Freq: Once | INTRAMUSCULAR | Status: AC
  Administered 2022-11-28: 10 mg via INTRAVENOUS
  Filled 2022-11-28: qty 2

## 2022-11-28 MED ORDER — ONDANSETRON 8 MG PO TBDP: 8.0000 mg | ORAL_TABLET | Freq: Three times a day (TID) | ORAL | 0 refills | Status: DC | PRN

## 2022-11-28 MED ORDER — POTASSIUM CHLORIDE CRYS ER 20 MEQ PO TBCR
40.0000 meq | EXTENDED_RELEASE_TABLET | Freq: Once | ORAL | Status: AC
  Administered 2022-11-28: 40 meq via ORAL
  Filled 2022-11-28: qty 2

## 2022-11-28 NOTE — ED Triage Notes (Signed)
C/o vomiting since yesterday. Unable to keep anything down. States 10lb weight loss since yesterday.  PCP said his BP was high yesterday, increased antihypertensive dose but he hasn't taken it yet.

## 2022-11-28 NOTE — ED Provider Notes (Signed)
[facility] Provider Note   CSN: [id number] Arrival date & time: 11/28/22  1646     History  Chief Complaint  Patient presents with   Vomiting    Kent Hall is a 37 y.o. male.  Pt c/o nausea and vomiting. Symptoms acute onset yesterday. Vomited several times. No bloody or bilious emesis. No diarrhea. Had bm yesterday. No abd distension or pain. No fever. No known bad food ingestion or ill contacts. Did have routine pcp visit yesterday, was started on metoprolol. No other new meds. No dysuria. No chest pain or discomfort. No sob.   The history is provided by the patient, medical records and a significant other.       Home Medications Prior to Admission medications   Medication Sig Start Date End Date Taking? Authorizing Provider  clonazePAM (KLONOPIN) 0.5 MG tablet 1 tablet as needed for panic attack Orally twice a day for 15 days 11/27/22  Yes [provider]  propranolol (INDERAL) 20 MG tablet 1 tablet Orally Twice a day for 30 day(s) 11/27/22  Yes [provider]  Escitalopram Oxalate (LEXAPRO PO) Take by mouth.    [provider]  lisinopril-hydrochlorothiazide (ZESTORETIC) 20-12.5 MG tablet 1 tablet Orally Once a day for 90 days    [provider]  oxyCODONE-acetaminophen (PERCOCET) 5-325 MG tablet Take 1 tablet by mouth every 4 (four) hours as needed. 10/01/19   Gilda Crease, MD  simvastatin (ZOCOR) 20 MG tablet 1 tablet in the evening Orally Once a day for 90 days    [provider]  traZODone (DESYREL) 100 MG tablet 1 tablet at bedtime Orally Once a day for 90 days    [provider]  valACYclovir (VALTREX) 1000 MG tablet Take 1 tablet (1,000 mg total) by mouth 3 (three) times daily. 10/01/19   Gilda Crease, MD      Allergies    Patient has no known allergies.    Review of Systems   Review of Systems  Constitutional:  Negative for chills and fever.   HENT:  Negative for sore throat.   Eyes:  Negative for redness.  Respiratory:  Negative for cough and shortness of breath.   Cardiovascular:  Negative for chest pain.  Gastrointestinal:  Positive for nausea and vomiting. Negative for abdominal pain.  Genitourinary:  Negative for dysuria and flank pain.  Musculoskeletal:  Negative for back pain and neck pain.  Skin:  Negative for rash.  Neurological:  Negative for headaches.  Hematological:  Does not bruise/bleed easily.  Psychiatric/Behavioral:  Negative for confusion.     Physical Exam Updated Vital Signs BP 136/86   Pulse 87   Temp 98.5 F (36.9 C) (Oral)   Resp 18   Ht 1.778 m (5\' 10" )   Wt 105.7 kg   SpO2 98%   BMI 33.43 kg/m  Physical Exam Vitals and nursing note reviewed.  Constitutional:      Appearance: Normal appearance. He is well-developed.  HENT:     Head: Atraumatic.     Nose: Nose normal.     Mouth/Throat:     Mouth: Mucous membranes are moist.  Eyes:     General: No scleral icterus.    Conjunctiva/sclera: Conjunctivae normal.     Pupils: Pupils are equal, round, and reactive to light.  Neck:     Trachea: No tracheal deviation.  Cardiovascular:     Rate and Rhythm: Regular rhythm. Tachycardia present.  Pulses: Normal pulses.     Heart sounds: Normal heart sounds. No murmur heard.    No friction rub. No gallop.  Pulmonary:     Effort: Pulmonary effort is normal. No accessory muscle usage or respiratory distress.     Breath sounds: Normal breath sounds.  Abdominal:     General: Bowel sounds are normal. There is no distension.     Palpations: Abdomen is soft. There is no mass.     Tenderness: There is no abdominal tenderness. There is no guarding.  Musculoskeletal:        General: No swelling.     Cervical back: Normal range of motion and neck supple. No rigidity.     Right lower leg: No edema.     Left lower leg: No edema.  Skin:    General: Skin is warm and dry.     Findings: No rash.   Neurological:     Mental Status: He is alert.     Comments: Alert, speech clear. Motor/sens grossly intact. Steady gait.   Psychiatric:        Mood and Affect: Mood normal.     ED Results / Procedures / Treatments   Labs (all labs ordered are listed, but only abnormal results are displayed) Results for orders placed or performed during the hospital encounter of 11/28/22  Lipase, blood  Result Value Ref Range   Lipase 69 (H) 11 - 51 U/L  Comprehensive metabolic panel  Result Value Ref Range   Sodium 132 (L) 135 - 145 mmol/L   Potassium 3.3 (L) 3.5 - 5.1 mmol/L   Chloride 93 (L) 98 - 111 mmol/L   CO2 24 22 - 32 mmol/L   Glucose, Bld 133 (H) 70 - 99 mg/dL   BUN 28 (H) 6 - 20 mg/dL   Creatinine, Ser 1.61 0.61 - 1.24 mg/dL   Calcium 9.5 8.9 - 09.6 mg/dL   Total Protein 8.9 (H) 6.5 - 8.1 g/dL   Albumin 5.0 3.5 - 5.0 g/dL   AST 26 15 - 41 U/L   ALT 30 0 - 44 U/L   Alkaline Phosphatase 75 38 - 126 U/L   Total Bilirubin 1.6 (H) 0.3 - 1.2 mg/dL   GFR, Estimated >04 >54 mL/min   Anion gap 15 5 - 15  CBC  Result Value Ref Range   WBC 10.1 4.0 - 10.5 K/uL   RBC 5.80 4.22 - 5.81 MIL/uL   Hemoglobin 17.8 (H) 13.0 - 17.0 g/dL   HCT 09.8 11.9 - 14.7 %   MCV 83.1 80.0 - 100.0 fL   MCH 30.7 26.0 - 34.0 pg   MCHC 36.9 (H) 30.0 - 36.0 g/dL   RDW 82.9 56.2 - 13.0 %   Platelets 472 (H) 150 - 400 K/uL   nRBC 0.0 0.0 - 0.2 %  Urinalysis, Routine w reflex microscopic -Urine, Clean Catch  Result Value Ref Range   Color, Urine YELLOW YELLOW   APPearance CLEAR CLEAR   Specific Gravity, Urine 1.015 1.005 - 1.030   pH 7.0 5.0 - 8.0   Glucose, UA NEGATIVE NEGATIVE mg/dL   Hgb urine dipstick NEGATIVE NEGATIVE   Bilirubin Urine NEGATIVE NEGATIVE   Ketones, ur NEGATIVE NEGATIVE mg/dL   Protein, ur NEGATIVE NEGATIVE mg/dL   Nitrite NEGATIVE NEGATIVE   Leukocytes,Ua NEGATIVE NEGATIVE  Rapid urine drug screen (hospital performed)  Result Value Ref Range   Opiates NONE DETECTED NONE DETECTED    Cocaine NONE DETECTED NONE DETECTED   Benzodiazepines  POSITIVE (A) NONE DETECTED   Amphetamines NONE DETECTED NONE DETECTED   Tetrahydrocannabinol POSITIVE (A) NONE DETECTED   Barbiturates NONE DETECTED NONE DETECTED    EKG None  Radiology CT ABDOMEN PELVIS W CONTRAST  Result Date: 11/28/2022 CLINICAL DATA:  Vomiting since yesterday. Bowel obstruction suspected. EXAM: CT ABDOMEN AND PELVIS WITH CONTRAST TECHNIQUE: Multidetector CT imaging of the abdomen and pelvis was performed using the standard protocol following bolus administration of intravenous contrast. RADIATION DOSE REDUCTION: This exam was performed according to the departmental dose-optimization program which includes automated exposure control, adjustment of the mA and/or kV according to patient size and/or use of iterative reconstruction technique. CONTRAST:  OMNIPAQUE IOHEXOL 300 MG/ML  SOLN COMPARISON:  CT 09/24/2019 FINDINGS: Lower chest: No acute abnormality. Hepatobiliary: No suspicious focal liver abnormality is seen. No gallstones, gallbladder wall thickening, or biliary dilatation. Pancreas: Unremarkable. No pancreatic ductal dilatation or surrounding inflammatory changes. Spleen: Normal in size without focal abnormality. Adrenals/Urinary Tract: Adrenal glands are unremarkable. Kidneys are normal, without renal calculi, suspicious focal lesion, or hydronephrosis. Bladder is unremarkable. Stomach/Bowel: Stomach is within normal limits. No evidence of bowel wall thickening, distention, or inflammatory changes. The appendix is normal. Vascular/Lymphatic: No significant vascular findings are present. No enlarged abdominal or pelvic lymph nodes. Reproductive: Unremarkable. Other: No free intraperitoneal fluid or air. Musculoskeletal: No acute or significant osseous findings. IMPRESSION: No acute process in the abdomen or pelvis. Electronically Signed   By: Minerva Fester M.D.   On: 11/28/2022 20:32    Procedures Procedures     Medications Ordered in ED Medications  potassium chloride SA (KLOR-CON M) CR tablet 40 mEq (has no administration in time range)  sodium chloride 0.9 % bolus 1,000 mL (0 mLs Intravenous Stopped 11/28/22 1827)  ondansetron (ZOFRAN) injection 4 mg (4 mg Intravenous Given 11/28/22 1724)  metoCLOPramide (REGLAN) injection 10 mg (10 mg Intravenous Given 11/28/22 1751)  lactated ringers bolus 1,000 mL ( Intravenous Stopped 11/28/22 1934)  diazepam (VALIUM) injection 2.5 mg (2.5 mg Intravenous Given 11/28/22 1749)  lactated ringers bolus 1,000 mL (1,000 mLs Intravenous New Bag/Given 11/28/22 1939)  ondansetron (ZOFRAN) injection 4 mg (4 mg Intravenous Given 11/28/22 1941)  iohexol (OMNIPAQUE) 300 MG/ML solution 100 mL (100 mLs Intravenous Contrast Given 11/28/22 2018)    ED Course/ Medical Decision Making/ A&P                             Medical Decision Making Problems Addressed: Dehydration: acute illness or injury with systemic symptoms that poses a threat to life or bodily functions Hypokalemia: acute illness or injury Nausea and vomiting in adult: acute illness or injury with systemic symptoms that poses a threat to life or bodily functions  Amount and/or Complexity of Data Reviewed Independent Historian:     Details: Spouse/so, hx External Data Reviewed: labs and notes. Labs: ordered. Decision-making details documented in ED Course. Radiology: ordered and independent interpretation performed. Decision-making details documented in ED Course.  Risk Prescription drug management. Parenteral controlled substances. Decision regarding hospitalization.   Iv ns. Continuous pulse ox and cardiac monitoring. Labs ordered/sent. Imaging ordered.   Differential diagnosis includes dehydration, aki, viral gastroenteritis, food poisoning, etc. Dispo decision including potential need for admission considered - will get labs and imaging and reassess.   Reviewed nursing notes and prior charts for additional  history. External reports reviewed. Additional history from: signif other/spouse.  Cardiac monitor: sinus rhythm, rate 110.  NS bolus. Zofran iv.  Labs reviewed/interpreted by me - wbc and hct normal. K mildly low.   Persistent nausea/vomiting, requests additional meds. Pt also appears mildly anxious. Mild abd tenderness. No cp or sob. Valium iv, reglan iv. Additional ivf.   Persistent nausea. Zofran iv. Given persistent/intractable nv, will get ct.   CT reviewed/interpreted by me - no sbo.   Additional labs reviewed/interpreted by me - +THC.   Given symptoms, neg ct, labs, suspect possible cannabinoid hyperemesis as cause of recurrent vomiting.   Nv improved. Pt currently appears stable for d/c.   Return precautions provided.            Final Clinical Impression(s) / ED Diagnoses Final diagnoses:  Nausea and vomiting in adult  Dehydration  Hypokalemia    Rx / DC Orders ED Discharge Orders     None         Cathren Laine, MD 11/28/22 2110

## 2022-11-28 NOTE — Discharge Instructions (Addendum)
It was our pleasure to provide your ER care today - we hope that you feel better.  Drink plenty of fluids/stay well hydrated. Take zofran as need for nausea.   Note that increasingly we are seeing a recurrent abdominal pain and/or recurrent vomiting syndrome called Cannabinoid Hyperemesis Syndrome - see attached info - in these cases avoiding marijuana use will prevent symptoms from recurrent (note that symptoms can persist for a few weeks if history of heavy marijuana use as it can take time to get out of system).   From today's labs, your potassium level is low - eat plenty of fruits and vegetables, and follow up with primary care doctor in one week.   Return to ER right away  if worse, fevers, new symptoms, new or worsening or severe abdominal pain, persistent vomiting, chest pain, trouble breathing, or other emergency concern.  You were given pain meds in the ER - no driving for the next 6 hours.

## 2022-11-30 ENCOUNTER — Other Ambulatory Visit: Payer: Self-pay

## 2022-11-30 ENCOUNTER — Emergency Department (HOSPITAL_BASED_OUTPATIENT_CLINIC_OR_DEPARTMENT_OTHER)
Admission: EM | Admit: 2022-11-30 | Discharge: 2022-11-30 | Disposition: A | Discharge status: Home / Self Care | Payer: BC Managed Care – PPO | Attending: Emergency Medicine | Admitting: Emergency Medicine

## 2022-11-30 ENCOUNTER — Encounter (HOSPITAL_BASED_OUTPATIENT_CLINIC_OR_DEPARTMENT_OTHER): Payer: Self-pay | Admitting: Emergency Medicine

## 2022-11-30 DIAGNOSIS — E876 Hypokalemia: Secondary | ICD-10-CM | POA: Diagnosis not present

## 2022-11-30 DIAGNOSIS — I10 Essential (primary) hypertension: Secondary | ICD-10-CM | POA: Diagnosis not present

## 2022-11-30 DIAGNOSIS — Z79899 Other long term (current) drug therapy: Secondary | ICD-10-CM | POA: Diagnosis not present

## 2022-11-30 DIAGNOSIS — R748 Abnormal levels of other serum enzymes: Secondary | ICD-10-CM | POA: Diagnosis not present

## 2022-11-30 DIAGNOSIS — R197 Diarrhea, unspecified: Secondary | ICD-10-CM | POA: Insufficient documentation

## 2022-11-30 DIAGNOSIS — R1115 Cyclical vomiting syndrome unrelated to migraine: Secondary | ICD-10-CM

## 2022-11-30 DIAGNOSIS — R112 Nausea with vomiting, unspecified: Secondary | ICD-10-CM | POA: Diagnosis present

## 2022-11-30 LAB — URINALYSIS, ROUTINE W REFLEX MICROSCOPIC
Bilirubin Urine: NEGATIVE
Glucose, UA: NEGATIVE mg/dL
Hgb urine dipstick: NEGATIVE
Ketones, ur: NEGATIVE mg/dL
Leukocytes,Ua: NEGATIVE
Nitrite: NEGATIVE
Protein, ur: NEGATIVE mg/dL
Specific Gravity, Urine: 1.01 (ref 1.005–1.030)
pH: 7 (ref 5.0–8.0)

## 2022-11-30 LAB — CBC WITH DIFFERENTIAL/PLATELET
Abs Immature Granulocytes: 0.01 10*3/uL (ref 0.00–0.07)
Basophils Absolute: 0 10*3/uL (ref 0.0–0.1)
Basophils Relative: 0 %
Eosinophils Absolute: 0 10*3/uL (ref 0.0–0.5)
Eosinophils Relative: 0 %
HCT: 41.7 % (ref 39.0–52.0)
Hemoglobin: 15.1 g/dL (ref 13.0–17.0)
Immature Granulocytes: 0 %
Lymphocytes Relative: 36 %
Lymphs Abs: 2.4 10*3/uL (ref 0.7–4.0)
MCH: 30.9 pg (ref 26.0–34.0)
MCHC: 36.2 g/dL — ABNORMAL HIGH (ref 30.0–36.0)
MCV: 85.5 fL (ref 80.0–100.0)
Monocytes Absolute: 0.8 10*3/uL (ref 0.1–1.0)
Monocytes Relative: 12 %
Neutro Abs: 3.4 10*3/uL (ref 1.7–7.7)
Neutrophils Relative %: 52 %
Platelets: 307 10*3/uL (ref 150–400)
RBC: 4.88 MIL/uL (ref 4.22–5.81)
RDW: 11.6 % (ref 11.5–15.5)
WBC: 6.7 10*3/uL (ref 4.0–10.5)
nRBC: 0 % (ref 0.0–0.2)

## 2022-11-30 LAB — COMPREHENSIVE METABOLIC PANEL
ALT: 22 U/L (ref 0–44)
AST: 19 U/L (ref 15–41)
Albumin: 4.3 g/dL (ref 3.5–5.0)
Alkaline Phosphatase: 63 U/L (ref 38–126)
Anion gap: 13 (ref 5–15)
BUN: 14 mg/dL (ref 6–20)
CO2: 27 mmol/L (ref 22–32)
Calcium: 8.6 mg/dL — ABNORMAL LOW (ref 8.9–10.3)
Chloride: 95 mmol/L — ABNORMAL LOW (ref 98–111)
Creatinine, Ser: 1.03 mg/dL (ref 0.61–1.24)
GFR, Estimated: >60 mL/min (ref >60)
Glucose, Bld: 125 mg/dL — ABNORMAL HIGH (ref 70–99)
Potassium: 3 mmol/L — ABNORMAL LOW (ref 3.5–5.1)
Sodium: 135 mmol/L (ref 135–145)
Total Bilirubin: 1.6 mg/dL — ABNORMAL HIGH (ref 0.3–1.2)
Total Protein: 7.4 g/dL (ref 6.5–8.1)

## 2022-11-30 LAB — LIPASE, BLOOD: Lipase: 31 U/L (ref 11–51)

## 2022-11-30 MED ORDER — METOCLOPRAMIDE HCL 10 MG PO TABS: 10.0000 mg | ORAL_TABLET | Freq: Four times a day (QID) | ORAL | 0 refills | Status: DC | PRN

## 2022-11-30 MED ORDER — LACTATED RINGERS IV BOLUS
1000.0000 mL | Freq: Once | INTRAVENOUS | Status: AC
  Administered 2022-11-30: 1000 mL via INTRAVENOUS

## 2022-11-30 MED ORDER — DROPERIDOL 2.5 MG/ML IJ SOLN
1.2500 mg | Freq: Once | INTRAMUSCULAR | Status: AC
  Administered 2022-11-30: 1.25 mg via INTRAVENOUS
  Filled 2022-11-30: qty 2

## 2022-11-30 MED ORDER — DROPERIDOL 2.5 MG/ML IJ SOLN
1.2500 mg | Freq: Once | INTRAMUSCULAR | Status: AC | PRN
  Administered 2022-11-30: 1.25 mg via INTRAVENOUS
  Filled 2022-11-30: qty 2

## 2022-11-30 MED ORDER — METOCLOPRAMIDE HCL 5 MG/ML IJ SOLN: 10.0000 mg | Freq: Once | INTRAMUSCULAR | Status: DC

## 2022-11-30 MED ORDER — POTASSIUM CHLORIDE CRYS ER 20 MEQ PO TBCR
40.0000 meq | EXTENDED_RELEASE_TABLET | Freq: Once | ORAL | Status: AC
  Administered 2022-11-30: 40 meq via ORAL
  Filled 2022-11-30: qty 2

## 2022-11-30 NOTE — ED Triage Notes (Signed)
Pt reports continued vomiting since he was last seen on 6/8. Denies new sx, states it just isn't improving.

## 2022-11-30 NOTE — ED Notes (Signed)
Pt was given urinal and advised that urine sample for UA is needed

## 2022-11-30 NOTE — ED Notes (Signed)
Pt was able to drink the ginger ale and eat the saltines in addition to the water he had.  No nausea at this time.  Gave pt some ginger ale and saltines to take home and advised to take saltines and drink plenty of fluid.  Pt ambulatory without any distress at d/c

## 2022-11-30 NOTE — ED Notes (Signed)
Pt given ice water to sip on.  Additional warm blankets also given for comfort.  Pt reports that nausea has resolved

## 2022-11-30 NOTE — ED Notes (Signed)
Pt has kept down the po potassium and one cup of water.  Given saltines and ginger ale at this time

## 2022-11-30 NOTE — ED Provider Notes (Signed)
[facility] Provider Note: Kent Dell, MD, FACEP  CSN: [id number] MRN: [id number] ARRIVAL: 11/30/22 at 0045 ROOM: [facility]   CHIEF COMPLAINT  Vomiting   HISTORY OF PRESENT ILLNESS  11/30/22 1:07 AM Kent Hall is a 37 y.o. male with vomiting that began 4 days ago without associated pain or diarrhea.  He was seen on 11/28/2022 for this.  Laboratory studies then showed slightly elevated lipase and total bilirubin and a mildly decreased potassium level.  CT scan of the abdomen and pelvis was unremarkable.  He was rehydrated and treated with multiple antiemetics.  He was then discharged in improved condition.  Nevertheless he has had persistent nausea and vomiting since discharge despite a prescription for ODT Zofran.  He admits to smoking marijuana but not on a daily basis, last use about a week ago.   Past Medical History:  Diagnosis Date   Back pain    High cholesterol    Hypertension     Past Surgical History:  Procedure Laterality Date   foot surgery from [facility]     VASECTOMY      History reviewed. No pertinent family history.  Social History   Tobacco Use   Smoking status: Never   Smokeless tobacco: Never  Vaping Use   Vaping Use: Never used  Substance Use Topics   Alcohol use: Not Currently   Drug use: Yes    Types: Marijuana    Prior to Admission medications   Medication Sig Start Date End Date Taking? Authorizing Provider  metoCLOPramide (REGLAN) 10 MG tablet Take 1 tablet (10 mg total) by mouth every 6 (six) hours as needed for nausea or vomiting. 11/30/22  Yes Kent Kendrix, MD  clonazePAM (KLONOPIN) 0.5 MG tablet 1 tablet as needed for panic attack Orally twice a day for 15 days 11/27/22   [provider]  Escitalopram Oxalate (LEXAPRO PO) Take by mouth.    [provider]  lisinopril-hydrochlorothiazide (ZESTORETIC) 20-12.5 MG tablet 1 tablet Orally Once a day for 90 days    [provider]  ondansetron (ZOFRAN-ODT) 8  MG disintegrating tablet Take 1 tablet (8 mg total) by mouth every 8 (eight) hours as needed for nausea or vomiting. 11/28/22   Kent Laine, MD  oxyCODONE-acetaminophen (PERCOCET) 5-325 MG tablet Take 1 tablet by mouth every 4 (four) hours as needed. 10/01/19   Gilda Crease, MD  propranolol (INDERAL) 20 MG tablet 1 tablet Orally Twice a day for 30 day(s) 11/27/22   [provider]  simvastatin (ZOCOR) 20 MG tablet 1 tablet in the evening Orally Once a day for 90 days    [provider]  traZODone (DESYREL) 100 MG tablet 1 tablet at bedtime Orally Once a day for 90 days    [provider]  valACYclovir (VALTREX) 1000 MG tablet Take 1 tablet (1,000 mg total) by mouth 3 (three) times daily. 10/01/19   Gilda Crease, MD    Allergies Patient has no known allergies.   REVIEW OF SYSTEMS  Negative except as noted here or in the History of Present Illness.   PHYSICAL EXAMINATION  Initial Vital Signs Blood pressure 137/87, pulse 79, temperature 98.3 F (36.8 C), temperature source Oral, resp. rate 14, height 5\' 10"  (1.778 m), weight 108.9 kg, SpO2 99 %.  Examination General: Well-developed, well-nourished male in no acute distress; appearance consistent with age of record HENT: normocephalic; atraumatic Eyes: Normal appearance Neck: supple Heart: regular rate and rhythm Lungs: clear to auscultation bilaterally Abdomen:  soft; nondistended; nontender; bowel sounds present Extremities: No deformity; full range of motion; pulses normal Neurologic: Awake, alert and oriented; motor function intact in all extremities and symmetric; no facial droop Skin: Warm and dry Psychiatric: Normal mood and affect   RESULTS  Summary of this visit's results, reviewed and interpreted by myself:   EKG Interpretation  Date/Time:  Monday November 30 2022 01:16:52 EDT Ventricular Rate:  74 PR Interval:  128 QRS Duration: 86 QT Interval:  377 QTC Calculation: 419 R  Axis:   18 Text Interpretation: Sinus rhythm Abnormal R-wave progression, early transition LVH by voltage Confirmed by Kent Hall, Kent Hall ([id number]) on 11/30/2022 1:21:27 AM       Laboratory Studies: Results for orders placed or performed during the hospital encounter of 11/30/22 (from the past 24 hour(s))  CBC with Differential     Status: Abnormal   Collection Time: 11/30/22  1:08 AM  Result Value Ref Range   WBC 6.7 4.0 - 10.5 K/uL   RBC 4.88 4.22 - 5.81 MIL/uL   Hemoglobin 15.1 13.0 - 17.0 g/dL   HCT 60.4 54.0 - 98.1 %   MCV 85.5 80.0 - 100.0 fL   MCH 30.9 26.0 - 34.0 pg   MCHC 36.2 (H) 30.0 - 36.0 g/dL   RDW 19.1 47.8 - 29.5 %   Platelets 307 150 - 400 K/uL   nRBC 0.0 0.0 - 0.2 %   Neutrophils Relative % 52 %   Neutro Abs 3.4 1.7 - 7.7 K/uL   Lymphocytes Relative 36 %   Lymphs Abs 2.4 0.7 - 4.0 K/uL   Monocytes Relative 12 %   Monocytes Absolute 0.8 0.1 - 1.0 K/uL   Eosinophils Relative 0 %   Eosinophils Absolute 0.0 0.0 - 0.5 K/uL   Basophils Relative 0 %   Basophils Absolute 0.0 0.0 - 0.1 K/uL   Immature Granulocytes 0 %   Abs Immature Granulocytes 0.01 0.00 - 0.07 K/uL  Comprehensive metabolic panel     Status: Abnormal   Collection Time: 11/30/22  1:08 AM  Result Value Ref Range   Sodium 135 135 - 145 mmol/L   Potassium 3.0 (L) 3.5 - 5.1 mmol/L   Chloride 95 (L) 98 - 111 mmol/L   CO2 27 22 - 32 mmol/L   Glucose, Bld 125 (H) 70 - 99 mg/dL   BUN 14 6 - 20 mg/dL   Creatinine, Ser 6.21 0.61 - 1.24 mg/dL   Calcium 8.6 (L) 8.9 - 10.3 mg/dL   Total Protein 7.4 6.5 - 8.1 g/dL   Albumin 4.3 3.5 - 5.0 g/dL   AST 19 15 - 41 U/L   ALT 22 0 - 44 U/L   Alkaline Phosphatase 63 38 - 126 U/L   Total Bilirubin 1.6 (H) 0.3 - 1.2 mg/dL   GFR, Estimated >30 >86 mL/min   Anion gap 13 5 - 15  Lipase, blood     Status: None   Collection Time: 11/30/22  1:08 AM  Result Value Ref Range   Lipase 31 11 - 51 U/L  Urinalysis, Routine w reflex microscopic -Urine, Clean Catch     Status: None    Collection Time: 11/30/22  1:08 AM  Result Value Ref Range   Color, Urine YELLOW YELLOW   APPearance CLEAR CLEAR   Specific Gravity, Urine 1.010 1.005 - 1.030   pH 7.0 5.0 - 8.0   Glucose, UA NEGATIVE NEGATIVE mg/dL   Hgb urine dipstick NEGATIVE NEGATIVE   Bilirubin Urine NEGATIVE NEGATIVE  Ketones, ur NEGATIVE NEGATIVE mg/dL   Protein, ur NEGATIVE NEGATIVE mg/dL   Nitrite NEGATIVE NEGATIVE   Leukocytes,Ua NEGATIVE NEGATIVE   Imaging Studies: CT ABDOMEN PELVIS W CONTRAST  Result Date: 11/28/2022 CLINICAL DATA:  Vomiting since yesterday. Bowel obstruction suspected. EXAM: CT ABDOMEN AND PELVIS WITH CONTRAST TECHNIQUE: Multidetector CT imaging of the abdomen and pelvis was performed using the standard protocol following bolus administration of intravenous contrast. RADIATION DOSE REDUCTION: This exam was performed according to the departmental dose-optimization program which includes automated exposure control, adjustment of the mA and/or kV according to patient size and/or use of iterative reconstruction technique. CONTRAST:  OMNIPAQUE IOHEXOL 300 MG/ML  SOLN COMPARISON:  CT 09/24/2019 FINDINGS: Lower chest: No acute abnormality. Hepatobiliary: No suspicious focal liver abnormality is seen. No gallstones, gallbladder wall thickening, or biliary dilatation. Pancreas: Unremarkable. No pancreatic ductal dilatation or surrounding inflammatory changes. Spleen: Normal in size without focal abnormality. Adrenals/Urinary Tract: Adrenal glands are unremarkable. Kidneys are normal, without renal calculi, suspicious focal lesion, or hydronephrosis. Bladder is unremarkable. Stomach/Bowel: Stomach is within normal limits. No evidence of bowel wall thickening, distention, or inflammatory changes. The appendix is normal. Vascular/Lymphatic: No significant vascular findings are present. No enlarged abdominal or pelvic lymph nodes. Reproductive: Unremarkable. Other: No free intraperitoneal fluid or air.  Musculoskeletal: No acute or significant osseous findings. IMPRESSION: No acute process in the abdomen or pelvis. Electronically Signed   By: Minerva Fester M.D.   On: 11/28/2022 20:32    ED COURSE and MDM  Nursing notes, initial and subsequent vitals signs, including pulse oximetry, reviewed and interpreted by myself.  Vitals:   11/30/22 0105 11/30/22 0200 11/30/22 0300 11/30/22 0400  BP: 137/87 125/79 (!) 142/85 (!) 145/109  Pulse: 79 75 75 92  Resp: 14 11 20 19   Temp: 98.3 F (36.8 C)     TempSrc: Oral     SpO2: 99% 96% 99% 96%  Weight:      Height:       Medications  lactated ringers bolus 1,000 mL (0 mLs Intravenous Stopped 11/30/22 0232)  droperidol (INAPSINE) 2.5 MG/ML injection 1.25 mg (1.25 mg Intravenous Given 11/30/22 0125)  droperidol (INAPSINE) 2.5 MG/ML injection 1.25 mg (1.25 mg Intravenous Given 11/30/22 0237)  lactated ringers bolus 1,000 mL (0 mLs Intravenous Stopped 11/30/22 0406)  potassium chloride SA (KLOR-CON M) CR tablet 40 mEq (40 mEq Oral Given 11/30/22 0314)   4:19 AM Patient has been able to drink fluids and eat crackers without vomiting.  His nausea is relieved.  He has been rehydrated and his potassium has been repleted.  The cause of his nausea and vomiting is unclear.  As noted above he had an unremarkable CT scan.  I doubt cannabinoid hyperemesis syndrome as that usually involve significant pain.  Since he did not get relief with home Zofran we will try Reglan.   PROCEDURES  Procedures   ED DIAGNOSES     ICD-10-CM   1. Persistent vomiting in adult patient  R11.15     2. Hypokalemia due to excessive gastrointestinal loss of potassium  E87.6          Mikolaj Woolstenhulme, MD 11/30/22 [phone]

## 2023-02-10 ENCOUNTER — Emergency Department (HOSPITAL_BASED_OUTPATIENT_CLINIC_OR_DEPARTMENT_OTHER)
Admission: EM | Admit: 2023-02-10 | Discharge: 2023-02-11 | Disposition: A | Discharge status: Home / Self Care | Payer: BC Managed Care – PPO | Attending: Emergency Medicine | Admitting: Emergency Medicine

## 2023-02-10 ENCOUNTER — Other Ambulatory Visit: Payer: Self-pay

## 2023-02-10 ENCOUNTER — Encounter (HOSPITAL_BASED_OUTPATIENT_CLINIC_OR_DEPARTMENT_OTHER): Payer: Self-pay | Admitting: Emergency Medicine

## 2023-02-10 DIAGNOSIS — R112 Nausea with vomiting, unspecified: Secondary | ICD-10-CM | POA: Insufficient documentation

## 2023-02-10 DIAGNOSIS — R3912 Poor urinary stream: Secondary | ICD-10-CM | POA: Insufficient documentation

## 2023-02-10 LAB — LIPASE, BLOOD: Lipase: 26 U/L (ref 11–51)

## 2023-02-10 LAB — CBC WITH DIFFERENTIAL/PLATELET
Abs Immature Granulocytes: 0.02 10*3/uL (ref 0.00–0.07)
Basophils Absolute: 0 10*3/uL (ref 0.0–0.1)
Basophils Relative: 0 %
Eosinophils Absolute: 0 10*3/uL (ref 0.0–0.5)
Eosinophils Relative: 0 %
HCT: 43.7 % (ref 39.0–52.0)
Hemoglobin: 15.5 g/dL (ref 13.0–17.0)
Immature Granulocytes: 0 %
Lymphocytes Relative: 17 %
Lymphs Abs: 1.5 10*3/uL (ref 0.7–4.0)
MCH: 31.4 pg (ref 26.0–34.0)
MCHC: 35.5 g/dL (ref 30.0–36.0)
MCV: 88.6 fL (ref 80.0–100.0)
Monocytes Absolute: 0.6 10*3/uL (ref 0.1–1.0)
Monocytes Relative: 7 %
Neutro Abs: 6.4 10*3/uL (ref 1.7–7.7)
Neutrophils Relative %: 76 %
Platelets: 317 10*3/uL (ref 150–400)
RBC: 4.93 MIL/uL (ref 4.22–5.81)
RDW: 11.9 % (ref 11.5–15.5)
WBC: 8.5 10*3/uL (ref 4.0–10.5)
nRBC: 0 % (ref 0.0–0.2)

## 2023-02-10 LAB — COMPREHENSIVE METABOLIC PANEL
ALT: 28 U/L (ref 0–44)
AST: 32 U/L (ref 15–41)
Albumin: 4.6 g/dL (ref 3.5–5.0)
Alkaline Phosphatase: 64 U/L (ref 38–126)
Anion gap: 14 (ref 5–15)
BUN: 13 mg/dL (ref 6–20)
CO2: 24 mmol/L (ref 22–32)
Calcium: 9.2 mg/dL (ref 8.9–10.3)
Chloride: 95 mmol/L — ABNORMAL LOW (ref 98–111)
Creatinine, Ser: 1.06 mg/dL (ref 0.61–1.24)
GFR, Estimated: >60 mL/min (ref >60)
Glucose, Bld: 134 mg/dL — ABNORMAL HIGH (ref 70–99)
Potassium: 3.9 mmol/L (ref 3.5–5.1)
Sodium: 133 mmol/L — ABNORMAL LOW (ref 135–145)
Total Bilirubin: 1.4 mg/dL — ABNORMAL HIGH (ref 0.3–1.2)
Total Protein: 8 g/dL (ref 6.5–8.1)

## 2023-02-10 MED ORDER — DROPERIDOL 2.5 MG/ML IJ SOLN
2.5000 mg | Freq: Once | INTRAMUSCULAR | Status: AC
  Administered 2023-02-10: 2.5 mg via INTRAVENOUS
  Filled 2023-02-10: qty 2

## 2023-02-10 MED ORDER — LACTATED RINGERS IV BOLUS
1000.0000 mL | Freq: Once | INTRAVENOUS | Status: AC
  Administered 2023-02-10: 1000 mL via INTRAVENOUS

## 2023-02-10 NOTE — ED Triage Notes (Signed)
Pt reports vomiting that started yesterday; denies pain; sts he has not urinated since last night

## 2023-02-10 NOTE — ED Provider Notes (Signed)
[facility]  Provider Note  CSN: [id number] Arrival date & time: 02/10/23 2013  History Chief Complaint  Patient presents with   Emesis    Kent Hall is a 37 y.o. male with no significant PMH reports 2 days of persistent nausea, initially with vomiting, now with dry heaves. He has not had significant abdominal pain with it. Reports decreased urine output. No blood in emesis. He was seen for similar in June x 2, at that time he was told could be related to [facility] use. He has not been smoking as often, but still using fairly frequently, last was 3 days ago.    Home Medications Prior to Admission medications   Medication Sig Start Date End Date Taking? Authorizing Provider  promethazine (PHENERGAN) 25 MG tablet Take 1 tablet (25 mg total) by mouth every 6 (six) hours as needed for nausea or vomiting. 02/11/23  Yes Pollyann Savoy, MD  clonazePAM (KLONOPIN) 0.5 MG tablet 1 tablet as needed for panic attack Orally twice a day for 15 days 11/27/22   [provider]  Escitalopram Oxalate (LEXAPRO PO) Take by mouth.    [provider]  lisinopril-hydrochlorothiazide (ZESTORETIC) 20-12.5 MG tablet 1 tablet Orally Once a day for 90 days    [provider]  propranolol (INDERAL) 20 MG tablet 1 tablet Orally Twice a day for 30 day(s) 11/27/22   [provider]  simvastatin (ZOCOR) 20 MG tablet 1 tablet in the evening Orally Once a day for 90 days    [provider]  traZODone (DESYREL) 100 MG tablet 1 tablet at bedtime Orally Once a day for 90 days    [provider]  valACYclovir (VALTREX) 1000 MG tablet Take 1 tablet (1,000 mg total) by mouth 3 (three) times daily. 10/01/19   Gilda Crease, MD     Allergies    Patient has no known allergies.   Review of Systems   Review of Systems Please see HPI for pertinent positives and negatives  Physical Exam BP 130/81   Pulse 76   Temp 98.3  F (36.8 C) (Oral)   Resp 18   Ht 5\' 10"  (1.778 m)   Wt 111.1 kg   SpO2 100%   BMI 35.15 kg/m   Physical Exam Vitals and nursing note reviewed.  Constitutional:      Appearance: Normal appearance.  HENT:     Head: Normocephalic and atraumatic.     Nose: Nose normal.     Mouth/Throat:     Mouth: Mucous membranes are moist.  Eyes:     Extraocular Movements: Extraocular movements intact.     Conjunctiva/sclera: Conjunctivae normal.  Cardiovascular:     Rate and Rhythm: Normal rate.  Pulmonary:     Effort: Pulmonary effort is normal.     Breath sounds: Normal breath sounds.  Abdominal:     General: Abdomen is flat.     Palpations: Abdomen is soft.     Tenderness: There is no abdominal tenderness. There is no guarding.  Musculoskeletal:        General: No swelling. Normal range of motion.     Cervical back: Neck supple.  Skin:    General: Skin is warm and dry.  Neurological:     General: No focal deficit present.     Mental Status: He is alert.  Psychiatric:        Mood and Affect: Mood normal.     ED Results /  Procedures / Treatments   EKG None  Procedures Procedures  Medications Ordered in the ED Medications  droperidol (INAPSINE) 2.5 MG/ML injection 2.5 mg (2.5 mg Intravenous Given 02/10/23 2351)  lactated ringers bolus 1,000 mL (0 mLs Intravenous Stopped 02/11/23 0143)    Initial Impression and Plan  Patient here with persistent nausea and vomiting. Abdomen is benign. Vitals are reassuring. He reports decreased urine output, labs done in triage show normal CBC, unremarkable CMP and lipase. Will give a dose of droperidol, IVF and reassess.   ED Course   Clinical Course as of 02/11/23 0154  Thu Feb 11, 2023  [id number] Patient feeling better, able to urinate now and no further nausea. He was advised to stop all THC use. Will d/c with Rx for Phenergan as this has worked better for him in the past. PCP follow up, RTED for any other concerns.   [CS]    Clinical  Course User Index [CS] Pollyann Savoy, MD     MDM Rules/Calculators/A&P Medical Decision Making Problems Addressed: Nausea and vomiting, unspecified vomiting type: acute illness or injury  Amount and/or Complexity of Data Reviewed Labs: ordered. Decision-making details documented in ED Course.  Risk Prescription drug management.     Final Clinical Impression(s) / ED Diagnoses Final diagnoses:  Nausea and vomiting, unspecified vomiting type    Rx / DC Orders ED Discharge Orders          Ordered    promethazine (PHENERGAN) 25 MG tablet  Every 6 hours PRN        02/11/23 0154             Pollyann Savoy, MD 02/11/23 [phone]

## 2023-02-11 ENCOUNTER — Other Ambulatory Visit (HOSPITAL_BASED_OUTPATIENT_CLINIC_OR_DEPARTMENT_OTHER): Payer: Self-pay

## 2023-02-11 ENCOUNTER — Other Ambulatory Visit: Payer: Self-pay

## 2023-02-11 LAB — URINALYSIS, ROUTINE W REFLEX MICROSCOPIC
Bilirubin Urine: NEGATIVE
Glucose, UA: NEGATIVE mg/dL
Hgb urine dipstick: NEGATIVE
Ketones, ur: NEGATIVE mg/dL
Leukocytes,Ua: NEGATIVE
Nitrite: NEGATIVE
Protein, ur: NEGATIVE mg/dL
Specific Gravity, Urine: 1.015 (ref 1.005–1.030)
pH: 6.5 (ref 5.0–8.0)

## 2023-02-11 MED ORDER — PROMETHAZINE HCL 25 MG PO TABS
25.0000 mg | ORAL_TABLET | Freq: Four times a day (QID) | ORAL | 0 refills | Status: AC | PRN
  Filled 2023-02-11: qty 30, 8d supply, fill #0
